# Patient Record
Sex: Male | Born: 1965 | Race: Black or African American | Hispanic: No | Smoking: Current every day smoker
Health system: Southern US, Community
[De-identification: ages and names within clinical notes are randomized; demographics above are authoritative.]

## PROBLEM LIST (undated history)

## (undated) DIAGNOSIS — F419 Anxiety disorder, unspecified: Secondary | ICD-10-CM

## (undated) DIAGNOSIS — E119 Type 2 diabetes mellitus without complications: Secondary | ICD-10-CM

## (undated) HISTORY — PX: BACK SURGERY: SHX140

---

## 2020-05-27 ENCOUNTER — Encounter (HOSPITAL_COMMUNITY): Payer: Self-pay | Admitting: Emergency Medicine

## 2020-05-27 ENCOUNTER — Emergency Department (HOSPITAL_COMMUNITY)
Admission: EM | Admit: 2020-05-27 | Discharge: 2020-05-27 | Disposition: A | Discharge status: Home / Self Care | Payer: Self-pay | Attending: Emergency Medicine | Admitting: Emergency Medicine

## 2020-05-27 ENCOUNTER — Other Ambulatory Visit: Payer: Self-pay

## 2020-05-27 DIAGNOSIS — Z5321 Procedure and treatment not carried out due to patient leaving prior to being seen by health care provider: Secondary | ICD-10-CM | POA: Insufficient documentation

## 2020-05-27 DIAGNOSIS — R739 Hyperglycemia, unspecified: Secondary | ICD-10-CM | POA: Insufficient documentation

## 2020-05-27 DIAGNOSIS — R5383 Other fatigue: Secondary | ICD-10-CM | POA: Insufficient documentation

## 2020-05-27 DIAGNOSIS — W540XXD Bitten by dog, subsequent encounter: Secondary | ICD-10-CM | POA: Insufficient documentation

## 2020-05-27 DIAGNOSIS — S31050D Open bite of lower back and pelvis without penetration into retroperitoneum, subsequent encounter: Secondary | ICD-10-CM | POA: Insufficient documentation

## 2020-05-27 DIAGNOSIS — R630 Anorexia: Secondary | ICD-10-CM | POA: Insufficient documentation

## 2020-05-27 HISTORY — DX: Type 2 diabetes mellitus without complications: E11.9

## 2020-05-27 LAB — CBC
HCT: 49.7 % (ref 39.0–52.0)
Hemoglobin: 16.9 g/dL (ref 13.0–17.0)
MCH: 31.2 pg (ref 26.0–34.0)
MCHC: 34 g/dL (ref 30.0–36.0)
MCV: 91.7 fL (ref 80.0–100.0)
Platelets: 292 10*3/uL (ref 150–400)
RBC: 5.42 MIL/uL (ref 4.22–5.81)
RDW: 12.6 % (ref 11.5–15.5)
WBC: 9.6 10*3/uL (ref 4.0–10.5)
nRBC: 0 % (ref 0.0–0.2)

## 2020-05-27 LAB — BASIC METABOLIC PANEL
Anion gap: 13 (ref 5–15)
BUN: 17 mg/dL (ref 6–20)
CO2: 25 mmol/L (ref 22–32)
Calcium: 10.1 mg/dL (ref 8.9–10.3)
Chloride: 101 mmol/L (ref 98–111)
Creatinine, Ser: 1 mg/dL (ref 0.61–1.24)
GFR, Estimated: >60 mL/min (ref >60)
Glucose, Bld: 262 mg/dL — ABNORMAL HIGH (ref 70–99)
Potassium: 3.9 mmol/L (ref 3.5–5.1)
Sodium: 139 mmol/L (ref 135–145)

## 2020-05-27 LAB — CBG MONITORING, ED
Glucose-Capillary: 258 mg/dL — ABNORMAL HIGH (ref 70–99)
Glucose-Capillary: 185 mg/dL — ABNORMAL HIGH (ref 70–99)

## 2020-05-27 NOTE — ED Triage Notes (Signed)
Patient endorses fatigue x3 weeks, decreased appetite despite drinking ensures, states he has lost 65 lbs over 6 months. Also states he got bit by a dog 2 weekends ago on his back, washed that out with alcohol.

## 2020-05-27 NOTE — ED Notes (Signed)
Pt given diet gingerale, graham crackers, and peanut butter. 

## 2020-05-27 NOTE — ED Notes (Signed)
Pt didn't answer x3.

## 2020-05-27 NOTE — ED Notes (Signed)
Pt arrived back to the lobby.  Sts he had to drive his son home.

## 2020-06-17 ENCOUNTER — Other Ambulatory Visit: Payer: Self-pay

## 2020-06-17 ENCOUNTER — Emergency Department (HOSPITAL_COMMUNITY): Payer: Self-pay

## 2020-06-17 ENCOUNTER — Encounter (HOSPITAL_COMMUNITY): Payer: Self-pay

## 2020-06-17 ENCOUNTER — Emergency Department (HOSPITAL_COMMUNITY)
Admission: EM | Admit: 2020-06-17 | Discharge: 2020-06-17 | Disposition: A | Discharge status: Home / Self Care | Payer: Self-pay | Attending: Emergency Medicine | Admitting: Emergency Medicine

## 2020-06-17 DIAGNOSIS — Z20822 Contact with and (suspected) exposure to covid-19: Secondary | ICD-10-CM | POA: Insufficient documentation

## 2020-06-17 DIAGNOSIS — R6889 Other general symptoms and signs: Secondary | ICD-10-CM

## 2020-06-17 DIAGNOSIS — R0602 Shortness of breath: Secondary | ICD-10-CM | POA: Insufficient documentation

## 2020-06-17 DIAGNOSIS — R5382 Chronic fatigue, unspecified: Secondary | ICD-10-CM | POA: Insufficient documentation

## 2020-06-17 DIAGNOSIS — R079 Chest pain, unspecified: Secondary | ICD-10-CM | POA: Insufficient documentation

## 2020-06-17 DIAGNOSIS — R1033 Periumbilical pain: Secondary | ICD-10-CM | POA: Insufficient documentation

## 2020-06-17 DIAGNOSIS — E1165 Type 2 diabetes mellitus with hyperglycemia: Secondary | ICD-10-CM | POA: Insufficient documentation

## 2020-06-17 DIAGNOSIS — E871 Hypo-osmolality and hyponatremia: Secondary | ICD-10-CM | POA: Insufficient documentation

## 2020-06-17 DIAGNOSIS — R635 Abnormal weight gain: Secondary | ICD-10-CM | POA: Insufficient documentation

## 2020-06-17 LAB — CBC
HCT: 46.6 % (ref 39.0–52.0)
Hemoglobin: 16.9 g/dL (ref 13.0–17.0)
MCH: 31.6 pg (ref 26.0–34.0)
MCHC: 36.3 g/dL — ABNORMAL HIGH (ref 30.0–36.0)
MCV: 87.3 fL (ref 80.0–100.0)
Platelets: 293 10*3/uL (ref 150–400)
RBC: 5.34 MIL/uL (ref 4.22–5.81)
RDW: 12.4 % (ref 11.5–15.5)
WBC: 11.8 10*3/uL — ABNORMAL HIGH (ref 4.0–10.5)
nRBC: 0 % (ref 0.0–0.2)

## 2020-06-17 LAB — BASIC METABOLIC PANEL
Anion gap: 14 (ref 5–15)
BUN: 25 mg/dL — ABNORMAL HIGH (ref 6–20)
CO2: 28 mmol/L (ref 22–32)
Calcium: 9.7 mg/dL (ref 8.9–10.3)
Chloride: 91 mmol/L — ABNORMAL LOW (ref 98–111)
Creatinine, Ser: 1.18 mg/dL (ref 0.61–1.24)
GFR, Estimated: >60 mL/min (ref >60)
Glucose, Bld: 353 mg/dL — ABNORMAL HIGH (ref 70–99)
Potassium: 3.5 mmol/L (ref 3.5–5.1)
Sodium: 133 mmol/L — ABNORMAL LOW (ref 135–145)

## 2020-06-17 LAB — CBG MONITORING, ED: Glucose-Capillary: 353 mg/dL — ABNORMAL HIGH (ref 70–99)

## 2020-06-17 LAB — TROPONIN I (HIGH SENSITIVITY): Troponin I (High Sensitivity): 5 ng/L (ref <18)

## 2020-06-17 LAB — RESP PANEL BY RT-PCR (FLU A&B, COVID) ARPGX2
Influenza A by PCR: NEGATIVE
Influenza B by PCR: NEGATIVE
SARS Coronavirus 2 by RT PCR: NEGATIVE

## 2020-06-17 MED ORDER — ONDANSETRON HCL 4 MG/2ML IJ SOLN: 4.0000 mg | Freq: Once | INTRAMUSCULAR | Status: DC

## 2020-06-17 MED ORDER — LACTATED RINGERS IV SOLN: INTRAVENOUS | Status: DC

## 2020-06-17 MED ORDER — INSULIN ASPART 100 UNIT/ML ~~LOC~~ SOLN: 8.0000 [IU] | Freq: Once | SUBCUTANEOUS | Status: DC

## 2020-06-17 NOTE — ED Provider Notes (Signed)
Care received at signout from [facility] [place]. Please see her note for full HPI.  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  Received care pending CTA of the chest, abdomen and pelvis.  Unfortunately, before I was able to see and evaluate the patient, nursing staff informed me that he had left gets medical advice.  Unfortunately was not able to have the risk versus benefit conversation with him.  I attempted to contact the patient via his cell phone, unfortunately no answer, voicemail box unfortunately has not been set up yet.   Mare Ferrari, PA-C 06/17/20 1613    Tegeler, Canary Brim, MD 06/17/20 2329

## 2020-06-17 NOTE — ED Triage Notes (Signed)
Pt reports 1.5 months of sob, upper back pain, chest pain, and no appetite. Pt states he has lost 40 lbs. Moved here in August and does not have a PCP yet. Pt a.o, nad noted

## 2020-06-17 NOTE — ED Notes (Signed)
Pt left AMA. RN called patient to come back to take out pts IV. RN was getting blood for another patient when he left AMA.

## 2020-06-17 NOTE — ED Provider Notes (Signed)
[facility] EMERGENCY DEPARTMENT Provider Note   CSN: [id number] Arrival date & time: 06/17/20  1059     History Chief Complaint  Patient presents with  . Shortness of Breath  . Chest Pain    Travis Wang is a 55 y.o. male with history of DM non compliant with metformin, tobacco use presents to ER for evaluation of exertional shortness of breath, fatigue, early satiety, abdominal pain and unintentional weight loss of 40# over the last 3 months. 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  States he gets easily winded now with day to day things. Winded when getting out of his truck. Has been feeling light headed and like he might faint. Yesterday he drove to [place] and it took him 12 hours because he had to pull over on the highway because he kept "dozing off".  Has thoracic back pain that he thinks is related to the bouncing around while driving truck.  No syncope. No CP, palpitations. No leg swelling, orthopnea, PND.  No cardiac history of STEMI, CHF. Has chronic "seasonal" smoker's cough that is usually worse around this time of the year.  No hemoptysis. No fever. Unvaccinated for COVID.  Also, reports periumbilical abdominal pain that is intermittent, non radiating. Pain is worse when his stomach is empty.  Associated with nausea but no vomiting, decreased appetite, early satiety.  Will take 2-3 bites of food and be full.  Abdominal pain improves for 30-45 min after smoking marijuana which he does daily.  Thought it could be an ulcer because he is under a lot of stress at home (recent divorce).  Has been constipated.  No melena. Recently moved to [place]. History of anxiety in the past, no longer on medicines. Not getting adequate sleep and wakes up every 1-2 hours during the night.   HPI     Past Medical History:  Diagnosis Date  . Diabetes mellitus without complication (HCC)     There are no problems to display for this patient.   Past  Surgical History:  Procedure Laterality Date  . BACK SURGERY         No family history on file.  Social History   Substance Use Topics  . Alcohol use: Not Currently  . Drug use: Not Currently    Home Medications Prior to Admission medications   Not on File    Allergies    Patient has no known allergies.  Review of Systems   Review of Systems  Constitutional: Positive for appetite change, fatigue and unexpected weight change.  Respiratory: Positive for cough and shortness of breath.   Gastrointestinal: Positive for abdominal pain, constipation and nausea.  Musculoskeletal: Positive for back pain.  All other systems reviewed and are negative.   Physical Exam Updated Vital Signs BP 117/89   Pulse (!) 112   Temp (!) 97.5 F (36.4 C) (Oral)   Resp 18   SpO2 100%   Physical Exam Vitals and nursing note reviewed.  Constitutional:      Appearance: He is well-developed.     Comments: Non toxic.  HENT:     Head: Normocephalic and atraumatic.     Nose: Nose normal.  Eyes:     Conjunctiva/sclera: Conjunctivae normal.  Cardiovascular:     Rate and Rhythm: Normal rate and regular rhythm.     Pulses:          Radial pulses are 1+ on the right side and  1+ on the left side.       Dorsalis pedis pulses are 1+ on the right side and 1+ on the left side.     Heart sounds: Normal heart sounds.     Comments: No lower extremity edema.  No calf tenderness. Pulmonary:     Effort: Pulmonary effort is normal.     Breath sounds: Normal breath sounds.     Comments: Normal work of breathing.  Dry cough noted during exam.  No wheezing, crackles. Abdominal:     General: Bowel sounds are normal.     Palpations: Abdomen is soft.     Tenderness: There is abdominal tenderness (Umbilical, mild).     Comments: No G/R/R. No suprapubic or CVA tenderness. Negative Murphy's and McBurney's  Musculoskeletal:        General: Normal range of motion.     Cervical back: Normal range of motion.   Skin:    General: Skin is warm and dry.     Capillary Refill: Capillary refill takes less than 2 seconds.  Neurological:     Mental Status: He is alert.     Comments: Sensation and strength intact in UE and LE   Psychiatric:        Behavior: Behavior normal.     ED Results / Procedures / Treatments   Labs (all labs ordered are listed, but only abnormal results are displayed) Labs Reviewed  BASIC METABOLIC PANEL - Abnormal; Notable for the following components:      Result Value   Sodium 133 (*)    Chloride 91 (*)    Glucose, Bld 353 (*)    BUN 25 (*)    All other components within normal limits  CBC - Abnormal; Notable for the following components:   WBC 11.8 (*)    MCHC 36.3 (*)    All other components within normal limits  CBG MONITORING, ED - Abnormal; Notable for the following components:   Glucose-Capillary 353 (*)    All other components within normal limits  RESP PANEL BY RT-PCR (FLU A&B, COVID) ARPGX2  TROPONIN I (HIGH SENSITIVITY)    EKG EKG Interpretation  Date/Time:  Wednesday June 17 2020 11:06:27 EST Ventricular Rate:  107 PR Interval:  128 QRS Duration: 74 QT Interval:  332 QTC Calculation: 443 R Axis:   87 Text Interpretation: Sinus tachycardia Right atrial enlargement Borderline ECG No old tracing to compare Confirmed by Meridee Score [phone]) on 06/17/2020 1:44:41 PM   Radiology DG Chest 2 View  Result Date: 06/17/2020 CLINICAL DATA:  Shortness of breath.  Chest pain. EXAM: CHEST - 2 VIEW COMPARISON:  None. FINDINGS: The heart size and mediastinal contours are within normal limits. Both lungs are clear. No visible pleural effusions or pneumothorax. No acute osseous abnormality. IMPRESSION: No active cardiopulmonary disease. Electronically Signed   By: Feliberto Harts MD   On: 06/17/2020 11:37    Procedures Procedures   Medications Ordered in ED Medications  insulin aspart (novoLOG) injection 8 Units (has no administration in time range)   lactated ringers infusion (has no administration in time range)  ondansetron (ZOFRAN) injection 4 mg (has no administration in time range)    ED Course  I have reviewed the triage vital signs and the nursing notes.  Pertinent labs & imaging results that were available during my care of the patient were reviewed by me and considered in my medical decision making (see chart for details).  Clinical Course as of 06/17/20 1549  Wed Jun 17, 2020  1415 WBC(!): 11.8 [CG]  1415 Sodium(!): 133 [CG]  1415 Glucose(!): 353 [CG]  1415 BUN(!): 25 [CG]  1415 Anion gap: 14 [CG]  1415 Troponin I (High Sensitivity): 5 [CG]  442 55 year old male here with multiple complaints including poor appetite early satiety weight loss shortness of breath constipation difficulty urinating.  Somewhat cachectic although nontoxic-appearing.  Lab work was unrevealing for significant derangements.  We will get a CT PE and CT abdomen pelvis due to his longstanding smoking history.  Likely discharge with outpatient follow-up if no significant findings. [MB]    Clinical Course User Index [CG] Liberty Handy, PA-C [MB] Terrilee Files, MD   MDM Rules/Calculators/A&P                           55 y.o. yo with chief complaint of several complaints including exertional shortness of breath, fatigue, disrupted sleep, thoracic back pain, umbilical abdominal pain, nausea, early satiety, unintentional weight loss for the last 3 months.  History of diabetes used to be on Metformin, noncompliant.  On history of tobacco use, marijuana use.  Previous medical records available, triage and nursing notes reviewed to obtain more history and assist with MDM. No medical records available.   Differential diagnosis: PUD/gastritis associated abdominal pain.  Given longstanding tobacco use, COPD also possibility.  No hypervolemia on exam, hypoxia, lower extremity edema or calf tenderness.  Given duration of his symptoms also considered PE  less likely although he is a Naval architect.  Doubt CHF.  He has significant amount of stress in his getting poor sleep which could be contributing.  No distal neuro pulse deficits, consider dissection, AAA highly unlikely. ? Covid.  ER lab work and imaging ordered by triage RN and me, as above  I have personally visualized and interpreted ER diagnostic work up including labs and imaging.    Labs reveal -labs are vastly reassuring.  Hyperglycemia glucose 353 with normal K, anion gap.  Mild pseudohyponatremia.  WBC 11.8.  Hemoglobin is normal.  Troponin is 5.  Patient is having no chest pain, I do not think delta is necessary.  Imaging reveals -EKG reviewed by EDP Charm Barges shows sinus tachycardia but no acute ST elevations, signs of right ventricular strain, classic pericarditis.   Medications ordered -insulin, Zofran, IV fluids  1548: Patient discussed with EDP Charm Barges.  We will go ahead and get CTA PE study and CT A/P given his consolation of symptoms.  Patient reevaluated and updated on plan of care and shift change.  Pending CTs and medicines at shift change.  Care transferred to oncoming ED PA who will follow up on imaging, reassess and determine disposition. Final Clinical Impression(s) / ED Diagnoses Final diagnoses:  Shortness of breath  Periumbilical abdominal pain  Unintentional weight change  Chronic fatigue    Rx / DC Orders ED Discharge Orders    None       Liberty Handy, PA-C 06/17/20 1549    Terrilee Files, MD 06/17/20 1726

## 2020-07-17 ENCOUNTER — Emergency Department (HOSPITAL_BASED_OUTPATIENT_CLINIC_OR_DEPARTMENT_OTHER): Payer: Self-pay

## 2020-07-17 ENCOUNTER — Emergency Department (HOSPITAL_BASED_OUTPATIENT_CLINIC_OR_DEPARTMENT_OTHER)
Admission: EM | Admit: 2020-07-17 | Discharge: 2020-07-17 | Disposition: A | Discharge status: Home / Self Care | Payer: Self-pay | Attending: Emergency Medicine | Admitting: Emergency Medicine

## 2020-07-17 ENCOUNTER — Ambulatory Visit
Admission: EM | Admit: 2020-07-17 | Discharge: 2020-07-17 | Disposition: A | Discharge status: Home / Self Care | Payer: Self-pay | Attending: Family Medicine | Admitting: Family Medicine

## 2020-07-17 ENCOUNTER — Emergency Department (HOSPITAL_BASED_OUTPATIENT_CLINIC_OR_DEPARTMENT_OTHER): Payer: Self-pay | Admitting: Radiology

## 2020-07-17 ENCOUNTER — Encounter (HOSPITAL_BASED_OUTPATIENT_CLINIC_OR_DEPARTMENT_OTHER): Payer: Self-pay | Admitting: Obstetrics and Gynecology

## 2020-07-17 ENCOUNTER — Other Ambulatory Visit: Payer: Self-pay

## 2020-07-17 ENCOUNTER — Encounter: Payer: Self-pay | Admitting: Emergency Medicine

## 2020-07-17 DIAGNOSIS — R634 Abnormal weight loss: Secondary | ICD-10-CM

## 2020-07-17 DIAGNOSIS — K5900 Constipation, unspecified: Secondary | ICD-10-CM

## 2020-07-17 DIAGNOSIS — E1165 Type 2 diabetes mellitus with hyperglycemia: Secondary | ICD-10-CM | POA: Insufficient documentation

## 2020-07-17 DIAGNOSIS — F17210 Nicotine dependence, cigarettes, uncomplicated: Secondary | ICD-10-CM | POA: Insufficient documentation

## 2020-07-17 DIAGNOSIS — R1084 Generalized abdominal pain: Secondary | ICD-10-CM

## 2020-07-17 DIAGNOSIS — R5383 Other fatigue: Secondary | ICD-10-CM | POA: Insufficient documentation

## 2020-07-17 DIAGNOSIS — R739 Hyperglycemia, unspecified: Secondary | ICD-10-CM

## 2020-07-17 DIAGNOSIS — Z7984 Long term (current) use of oral hypoglycemic drugs: Secondary | ICD-10-CM | POA: Insufficient documentation

## 2020-07-17 DIAGNOSIS — E119 Type 2 diabetes mellitus without complications: Secondary | ICD-10-CM

## 2020-07-17 DIAGNOSIS — R42 Dizziness and giddiness: Secondary | ICD-10-CM

## 2020-07-17 HISTORY — DX: Anxiety disorder, unspecified: F41.9

## 2020-07-17 LAB — I-STAT VENOUS BLOOD GAS, ED
Acid-Base Excess: 3 mmol/L — ABNORMAL HIGH (ref 0.0–2.0)
Bicarbonate: 27.2 mmol/L (ref 20.0–28.0)
Calcium, Ion: 1.21 mmol/L (ref 1.15–1.40)
HCT: 50 % (ref 39.0–52.0)
Hemoglobin: 17 g/dL (ref 13.0–17.0)
O2 Saturation: 74 %
Patient temperature: 98.6
Potassium: 3.8 mmol/L (ref 3.5–5.1)
Sodium: 133 mmol/L — ABNORMAL LOW (ref 135–145)
TCO2: 28 mmol/L (ref 22–32)
pCO2, Ven: 40.7 mmHg — ABNORMAL LOW (ref 44.0–60.0)
pH, Ven: 7.433 — ABNORMAL HIGH (ref 7.250–7.430)
pO2, Ven: 38 mmHg (ref 32.0–45.0)

## 2020-07-17 LAB — POCT URINALYSIS DIP (MANUAL ENTRY)
Blood, UA: NEGATIVE
Glucose, UA: >=1000 mg/dL — AB
Nitrite, UA: NEGATIVE
Protein Ur, POC: 30 mg/dL — AB
Spec Grav, UA: 1.025 (ref 1.010–1.025)
Urobilinogen, UA: 1 E.U./dL
pH, UA: 6 (ref 5.0–8.0)

## 2020-07-17 LAB — CBC WITH DIFFERENTIAL/PLATELET
Abs Immature Granulocytes: 0.03 10*3/uL (ref 0.00–0.07)
Basophils Absolute: 0.1 10*3/uL (ref 0.0–0.1)
Basophils Relative: 0 %
Eosinophils Absolute: 0.1 10*3/uL (ref 0.0–0.5)
Eosinophils Relative: 0 %
HCT: 47.7 % (ref 39.0–52.0)
Hemoglobin: 16.9 g/dL (ref 13.0–17.0)
Immature Granulocytes: 0 %
Lymphocytes Relative: 25 %
Lymphs Abs: 2.9 10*3/uL (ref 0.7–4.0)
MCH: 31.1 pg (ref 26.0–34.0)
MCHC: 35.4 g/dL (ref 30.0–36.0)
MCV: 87.7 fL (ref 80.0–100.0)
Monocytes Absolute: 0.9 10*3/uL (ref 0.1–1.0)
Monocytes Relative: 8 %
Neutro Abs: 7.7 10*3/uL (ref 1.7–7.7)
Neutrophils Relative %: 67 %
Platelets: 270 10*3/uL (ref 150–400)
RBC: 5.44 MIL/uL (ref 4.22–5.81)
RDW: 12.7 % (ref 11.5–15.5)
WBC: 11.6 10*3/uL — ABNORMAL HIGH (ref 4.0–10.5)
nRBC: 0 % (ref 0.0–0.2)

## 2020-07-17 LAB — COMPREHENSIVE METABOLIC PANEL
ALT: 13 U/L (ref 0–44)
AST: 10 U/L — ABNORMAL LOW (ref 15–41)
Albumin: 5.1 g/dL — ABNORMAL HIGH (ref 3.5–5.0)
Alkaline Phosphatase: 43 U/L (ref 38–126)
Anion gap: 13 (ref 5–15)
BUN: 31 mg/dL — ABNORMAL HIGH (ref 6–20)
CO2: 25 mmol/L (ref 22–32)
Calcium: 10.4 mg/dL — ABNORMAL HIGH (ref 8.9–10.3)
Chloride: 96 mmol/L — ABNORMAL LOW (ref 98–111)
Creatinine, Ser: 0.99 mg/dL (ref 0.61–1.24)
GFR, Estimated: >60 mL/min (ref >60)
Glucose, Bld: 253 mg/dL — ABNORMAL HIGH (ref 70–99)
Potassium: 3.7 mmol/L (ref 3.5–5.1)
Sodium: 134 mmol/L — ABNORMAL LOW (ref 135–145)
Total Bilirubin: 0.9 mg/dL (ref 0.3–1.2)
Total Protein: 8 g/dL (ref 6.5–8.1)

## 2020-07-17 LAB — HEMOGLOBIN A1C
Hgb A1c MFr Bld: 9.5 % — ABNORMAL HIGH (ref 4.8–5.6)
Mean Plasma Glucose: 225.95 mg/dL

## 2020-07-17 LAB — TROPONIN I (HIGH SENSITIVITY): Troponin I (High Sensitivity): 3 ng/L (ref <18)

## 2020-07-17 LAB — LIPASE, BLOOD: Lipase: 16 U/L (ref 11–51)

## 2020-07-17 LAB — CBG MONITORING, ED: Glucose-Capillary: 250 mg/dL — ABNORMAL HIGH (ref 70–99)

## 2020-07-17 LAB — BRAIN NATRIURETIC PEPTIDE: B Natriuretic Peptide: 16.9 pg/mL (ref 0.0–100.0)

## 2020-07-17 LAB — D-DIMER, QUANTITATIVE: D-Dimer, Quant: <0.27 ug/mL-FEU (ref 0.00–0.50)

## 2020-07-17 LAB — POCT FASTING CBG KUC MANUAL ENTRY: POCT Glucose (KUC): 269 mg/dL — AB (ref 70–99)

## 2020-07-17 MED ORDER — IOHEXOL 300 MG/ML  SOLN
100.0000 mL | Freq: Once | INTRAMUSCULAR | Status: AC | PRN
  Administered 2020-07-17: 100 mL via INTRAVENOUS

## 2020-07-17 MED ORDER — SODIUM CHLORIDE 0.9 % IV BOLUS
1000.0000 mL | Freq: Once | INTRAVENOUS | Status: AC
  Administered 2020-07-17: 1000 mL via INTRAVENOUS

## 2020-07-17 MED ORDER — POLYETHYLENE GLYCOL 3350 17 G PO PACK: 17.0000 g | PACK | Freq: Every day | ORAL | 0 refills | Status: AC

## 2020-07-17 MED ORDER — METFORMIN HCL 500 MG PO TABS: 500.0000 mg | ORAL_TABLET | Freq: Two times a day (BID) | ORAL | 1 refills | Status: DC

## 2020-07-17 NOTE — ED Provider Notes (Signed)
[facility]    CSN: [id number] Arrival date & time: 07/17/20  1320      History   Chief Complaint Chief Complaint  Patient presents with  . Abdominal Pain  . Constipation    HPI Travis Wang is a 55 y.o. male.   HPI  Patient presents for evaluation of ongoing abdominal pain , constipation, and uncontrolled diabetes. Patient seen at ER on 06-17-2020 with SOB, abdominal pain and unintentional weight loss. Patient's blood sugar was found to be mid 300's. He doesn't take medication for diabetes or does he have a primary care due to financial problems.  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  He has been checking his blood glucose intermittently and has noticed this been progressively rising.  He reports that his baseline weight is about 210 and currently he is weighing approximately 170-175 lbs and this weight loss is taking place over the course of the last 3 to 4 months.  He has taken laxative and has been unable to pass stool.  Due to inability to pass stool he is felt full without eating much food and has had shortness of breath in the midepigastric area.  Past Medical History:  Diagnosis Date  . Diabetes mellitus without complication (HCC)     There are no problems to display for this patient.   Past Surgical History:  Procedure Laterality Date  . BACK SURGERY         Home Medications    Prior to Admission medications   Not on File    Family History History reviewed. No pertinent family history.  Social History Social History   Substance Use Topics  . Alcohol use: Not Currently  . Drug use: Not Currently     Allergies   Patient has no known allergies.   Review of Systems Review of Systems Pertinent negatives listed in HPI   Physical Exam Triage Vital Signs ED Triage Vitals [07/17/20 1433]  Enc Vitals Group     BP 117/78     Pulse Rate (!) 116     Resp (!) 22     Temp 98.8 F (37.1 C)     Temp Source  Oral     SpO2 97 %     Weight      Height      Head Circumference      Peak Flow      Pain Score 4     Pain Loc      Pain Edu?      Excl. in GC?    No data found.  Updated Vital Signs BP 117/78 (BP Location: Left Arm)   Pulse (!) 116   Temp 98.8 F (37.1 C) (Oral)   Resp (!) 22   SpO2 97%   Visual Acuity Right Eye Distance:   Left Eye Distance:   Bilateral Distance:    Right Eye Near:   Left Eye Near:    Bilateral Near:     Physical Exam Constitutional:      Appearance: He is underweight. He is ill-appearing.     Comments: Unhealthy appearance and appears older than stated age  Cardiovascular:     Rate and Rhythm: Regular rhythm. Tachycardia present.  Pulmonary:     Effort: Pulmonary effort is normal.     Breath sounds: Normal breath sounds.  Abdominal:     General: Bowel sounds are decreased.     Tenderness: There is generalized abdominal tenderness.  Skin:  General: Skin is warm.     Comments: Mild jaundice appearance  Neurological:     General: No focal deficit present.  Psychiatric:        Mood and Affect: Mood normal.        Behavior: Behavior normal.      UC Treatments / Results  Labs (all labs ordered are listed, but only abnormal results are displayed) Labs Reviewed  POCT FASTING CBG [facility] MANUAL ENTRY - Abnormal; Notable for the following components:      Result Value   POCT Glucose ([facility]) 269 (*)    All other components within normal limits  POCT URINALYSIS DIP (MANUAL ENTRY)    EKG   Radiology No results found.  Procedures Procedures (including critical care time)  Medications Ordered in UC Medications - No data to display  Initial Impression / Assessment and Plan / UC Course  I have reviewed the triage vital signs and the nursing notes.  Pertinent labs & imaging results that were available during my care of the patient were reviewed by me and considered in my medical decision making (see chart for details).    Given  presentation of patient and hyperglycemia with UA revealing a large amount of ketones and glucose present patient needs further work-up including labs and further diagnostic assessment in the setting of the emergency department to determine source of acute weight loss, labs to assess for possible DKA, and further work-up of possible gastroparesis or other etiology that is causing severe constipation and inability to pass stool along with severe abdominal pain.  Patient sent med [address] bridge for further work-up and evaluation.  Patient also is requesting extended leave from work due to current medical concerns and he does not have a primary care provider patient was advised to follow-up after ER visit at occupational medicine for evaluation of fitness to return to work. Final Clinical Impressions(s) / UC Diagnoses   Final diagnoses:  Hyperglycemia  Unintentional weight loss  Dizzinesses  Constipation in male     Discharge Instructions     Go immediately to emergency department for further work-up and evaluation of ongoing abdominal pain, inability to pass stool for greater than a week, should shortness of breath, ketones in urine and persistent hyperglycemia. The symptoms can be life-threatening therefore go immediately to the emergency department to determine cause of symptoms.    ED Prescriptions    None     PDMP not reviewed this encounter.   Bing Neighbors, FNP 07/17/20 [phone]

## 2020-07-17 NOTE — TOC Initial Note (Signed)
..  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 States he does not have PCP or insurance. Scheduled follow up appt at [facility] on 08/20/2020 at 2:10 pm. Pt will need one refill on his meds. Explained [facility] has $4 list for their meds. ED provider updated.      ED Mini Assessment: What brought you to the Emergency Department? : dizziness  Barriers to Discharge: Continued Medical Work up  [employer] interventions: appt arranged for follow up  Means of departure: Car  Interventions which prevented an admission or readmission: Follow-up medical appointment,Medication Review    Patient Contact and Communications        ,          Patient states their goals for this hospitalization and ongoing recovery are:: wants to be able to work      Admission diagnosis:  sent by dr,possible dka There are no problems to display for this patient.  PCP:  Patient, No Pcp Per Pharmacy:   [facility] [address] ([address]),  - [address] [place] [address] Phone: [phone] Fax: [phone]

## 2020-07-17 NOTE — Discharge Instructions (Addendum)
Go immediately to emergency department for further work-up and evaluation of ongoing abdominal pain, inability to pass stool for greater than a week, should shortness of breath, ketones in urine and persistent hyperglycemia. The symptoms can be life-threatening therefore go immediately to the emergency department to determine cause of symptoms.

## 2020-07-17 NOTE — ED Notes (Signed)
Venous Blood Gas obtained with IV drawl, MD shown results.

## 2020-07-17 NOTE — ED Triage Notes (Signed)
Pt here for abd pain with constipation no BM x 10 days; pt sts taking laxatives without result; pt seen last month for weight loss; pt sts increased CBG; pt hasnt followed up about either due to financial situation

## 2020-07-17 NOTE — ED Notes (Signed)
ED Provider at bedside. 

## 2020-07-17 NOTE — ED Provider Notes (Signed)
[facility] EMERGENCY DEPT Provider Note   CSN: [id number] Arrival date & time: 07/17/20  1631     History Chief Complaint  Patient presents with  . Hyperglycemia  . Abdominal Pain    Travis Wang is a 55 y.o. male.  Presented to the emergency room with concern for abdominal pain, weight loss, elevated blood sugar.  Patient went to urgent care earlier today, sent to ER due to abnormal urinalysis, rule out DKA.  Patient reports that over the past few months he has had some weight loss, also has increased fatigue.  States that he gets winded easily.  Does not feel short of breath at present.  No chest pain at present.  States that he struggled with abdominal pain over the past month.  Intermittent, improved with eating.  Not currently having pain.  Described as tightness across abdomen.  He reports he has been diagnosed with diabetes previously but has not been on his Metformin due to not having a local doctor.  HPI     Past Medical History:  Diagnosis Date  . Anxiety   . Diabetes mellitus without complication (HCC)     There are no problems to display for this patient.   Past Surgical History:  Procedure Laterality Date  . BACK SURGERY         No family history on file.  Social History   Tobacco Use  . Smoking status: Current Every Day Smoker    Types: Cigarettes  Vaping Use  . Vaping Use: Every day  . Substances: Nicotine  Substance Use Topics  . Alcohol use: Yes  . Drug use: Yes    Types: Marijuana    Home Medications Prior to Admission medications   Medication Sig Start Date End Date Taking? Authorizing Provider  metFORMIN (GLUCOPHAGE) 500 MG tablet Take 1 tablet (500 mg total) by mouth 2 (two) times daily with a meal. 07/17/20  Yes Natashia Roseman, Quitman Livings, MD  polyethylene glycol (MIRALAX) 17 g packet Take 17 g by mouth daily. 07/17/20  Yes Milagros Loll, MD    Allergies    Patient has no known allergies.  Review of Systems   Review  of Systems  Constitutional: Positive for fatigue. Negative for chills and fever.  HENT: Negative for ear pain and sore throat.   Eyes: Negative for pain and visual disturbance.  Respiratory: Positive for shortness of breath. Negative for cough.   Cardiovascular: Negative for chest pain and palpitations.  Gastrointestinal: Positive for abdominal pain. Negative for vomiting.  Genitourinary: Negative for dysuria and hematuria.  Musculoskeletal: Negative for arthralgias and back pain.  Skin: Negative for color change and rash.  Neurological: Negative for seizures and syncope.  All other systems reviewed and are negative.   Physical Exam Updated Vital Signs BP 110/66 (BP Location: Right Arm)   Pulse 76   Temp 98 F (36.7 C) (Oral)   Resp (!) 22   Ht 5\' 10"  (1.778 m)   Wt 83.9 kg   SpO2 100%   BMI 26.54 kg/m   Physical Exam Vitals and nursing note reviewed.  Constitutional:      Appearance: He is well-developed.  HENT:     Head: Normocephalic and atraumatic.  Eyes:     Conjunctiva/sclera: Conjunctivae normal.  Cardiovascular:     Rate and Rhythm: Normal rate and regular rhythm.     Heart sounds: No murmur heard.   Pulmonary:     Effort: Pulmonary effort is normal. No respiratory distress.  Breath sounds: Normal breath sounds.  Abdominal:     Palpations: Abdomen is soft.     Tenderness: There is no abdominal tenderness.     Comments: Some tenderness noted in center of abdomen, no rebound or guarding, no tenderness at McBurney's point and negative Murphy sign  Musculoskeletal:     Cervical back: Neck supple.  Skin:    General: Skin is warm and dry.  Neurological:     General: No focal deficit present.     Mental Status: He is alert.  Psychiatric:        Mood and Affect: Mood normal.        Behavior: Behavior normal.     ED Results / Procedures / Treatments   Labs (all labs ordered are listed, but only abnormal results are displayed) Labs Reviewed  CBC WITH  DIFFERENTIAL/PLATELET - Abnormal; Notable for the following components:      Result Value   WBC 11.6 (*)    All other components within normal limits  COMPREHENSIVE METABOLIC PANEL - Abnormal; Notable for the following components:   Sodium 134 (*)    Chloride 96 (*)    Glucose, Bld 253 (*)    BUN 31 (*)    Calcium 10.4 (*)    Albumin 5.1 (*)    AST 10 (*)    All other components within normal limits  CBG MONITORING, ED - Abnormal; Notable for the following components:   Glucose-Capillary 250 (*)    All other components within normal limits  I-STAT VENOUS BLOOD GAS, ED - Abnormal; Notable for the following components:   pH, Ven 7.433 (*)    pCO2, Ven 40.7 (*)    Acid-Base Excess 3.0 (*)    Sodium 133 (*)    All other components within normal limits  LIPASE, BLOOD  D-DIMER, QUANTITATIVE  BRAIN NATRIURETIC PEPTIDE  BLOOD GAS, VENOUS  HEMOGLOBIN A1C  TROPONIN I (HIGH SENSITIVITY)    EKG None  Radiology DG Chest 2 View  Result Date: 07/17/2020 CLINICAL DATA:  Chest pain and dizziness. EXAM: CHEST - 2 VIEW COMPARISON:  Chest x-ray 06/17/2020 FINDINGS: The cardiac silhouette, mediastinal and hilar contours are normal. The lungs are clear. No pleural effusions. No pulmonary lesions. No pneumothorax. The bony thorax is intact. IMPRESSION: No acute cardiopulmonary findings. Electronically Signed   By: Rudie Meyer M.D.   On: 07/17/2020 17:54   CT ABDOMEN PELVIS W CONTRAST  Result Date: 07/17/2020 CLINICAL DATA:  Acute abdominal pain. EXAM: CT ABDOMEN AND PELVIS WITH CONTRAST TECHNIQUE: Multidetector CT imaging of the abdomen and pelvis was performed using the standard protocol following bolus administration of intravenous contrast. CONTRAST:  OMNIPAQUE IOHEXOL 300 MG/ML  SOLN COMPARISON:  None. FINDINGS: Lower chest: The lung bases are clear of acute process. No pleural effusion or pulmonary lesions. The heart is normal in size. No pericardial effusion. The distal esophagus and  aorta are unremarkable. Hepatobiliary: No hepatic lesions or intrahepatic biliary dilatation. The gallbladder is normal. No common bile duct dilatation. Pancreas: No mass, inflammation or ductal dilatation. Mild pancreatic atrophy. Spleen: Normal size. Small enhancing lesion in the upper aspect of the spleen likely benign hemangioma. Adrenals/Urinary Tract: The adrenal glands and kidneys are unremarkable. No renal lesions, renal calculi or hydronephrosis. No findings suspicious for pyelonephritis. The bladder is unremarkable. Stomach/Bowel: The stomach, duodenum, small bowel and colon are unremarkable. No acute inflammatory changes, mass lesions or obstructive findings. The terminal ileum is normal. The appendix is normal. Vascular/Lymphatic: The aorta is  normal in caliber. No dissection. The branch vessels are patent. The major venous structures are patent. No mesenteric or retroperitoneal mass or adenopathy. Small scattered lymph nodes are noted. Reproductive: The prostate gland and seminal vesicles are unremarkable. Other: No pelvic mass or adenopathy. No free pelvic fluid collections. No inguinal mass or adenopathy. No abdominal wall hernia or subcutaneous lesions. Musculoskeletal: No significant bony findings. Moderate age advanced degenerative disc disease noted at L5-S1. IMPRESSION: 1. No acute abdominal/pelvic findings, mass lesions or adenopathy. 2. Age advanced degenerative disc disease at L5-S1. Electronically Signed   By: Rudie Meyer M.D.   On: 07/17/2020 18:56    Procedures Procedures   Medications Ordered in ED Medications  sodium chloride 0.9 % bolus 1,000 mL (0 mLs Intravenous Stopped 07/17/20 1918)  iohexol (OMNIPAQUE) 300 MG/ML solution 100 mL (100 mLs Intravenous Contrast Given 07/17/20 1820)    ED Course  I have reviewed the triage vital signs and the nursing notes.  Pertinent labs & imaging results that were available during my care of the patient were reviewed by me and  considered in my medical decision making (see chart for details).    MDM Rules/Calculators/A&P                         55 year old male presents to ER with concern for abnormal UA, elevated blood sugar, abdominal pain, weight loss, fatigue.  On exam, patient is well-appearing with stable vital signs.  Noted some mild tenderness in abdomen but no rebound or guarding was noted.  Basic labs were grossly stable.  They are not consistent with DKA.  Suspect his modest hyperglycemia is related to diabetes.  CT abdomen pelvis negative for acute pathology.  Recommend he start back on Metformin.  Recommend he follow-up with a primary doctor.  Case management made arrangements for [facility] health and wellness follow-up.  Patient was also concerned about constipation, recommended MiraLAX.   After the discussed management above, the patient was determined to be safe for discharge.  The patient was in agreement with this plan and all questions regarding their care were answered.  ED return precautions were discussed and the patient will return to the ED with any significant worsening of condition.   Final Clinical Impression(s) / ED Diagnoses Final diagnoses:  Elevated blood sugar  Type 2 diabetes mellitus without complication, without long-term current use of insulin (HCC)  Fatigue, unspecified type  Generalized abdominal pain  Constipation, unspecified constipation type    Rx / DC Orders ED Discharge Orders         Ordered    metFORMIN (GLUCOPHAGE) 500 MG tablet  2 times daily with meals        07/17/20 1911    polyethylene glycol (MIRALAX) 17 g packet  Daily        07/17/20 1911           Milagros Loll, MD 07/17/20 1939

## 2020-07-17 NOTE — ED Notes (Signed)
Patient given warm blanket and fluids restarted. Patient denies needs at this time.

## 2020-07-17 NOTE — Discharge Instructions (Signed)
Follow-up with community health and wellness as discussed.  Take Metformin as discussed.  For your constipation, I would recommend tomorrow morning take 1 dose of MiraLAX every 1-2 hours up to 4 or 5 doses or until you have a bowel movement.  Then on subsequent days I would recommend taking 1 dose of MiraLAX just once a day.  Take Tylenol or Motrin for pain control.  If you develop nausea, vomiting, fevers, worsening abdominal pain or other new concerning symptom, return to ER for reassessment.

## 2020-07-17 NOTE — ED Triage Notes (Signed)
Patient reports here from urgent care. Patient reports he was there for constipation. Patient states he has lost 30lbs in the last 29months . Patient reports no BM x10 days. Patient reports CBG 256.

## 2020-07-20 ENCOUNTER — Ambulatory Visit: Payer: Self-pay | Admitting: *Deleted

## 2020-07-20 NOTE — Telephone Encounter (Signed)
See previous TE for summary and advice provided to the patient.    Travis Fells, RN  Registered Nurse    Telephone Encounter  Sign at exiting of workspace  Encounter Date:  07/20/2020           Sign at exiting of workspace           Show:Clear all [x] Manual[] Template[] Copied  Added by: [x] [name], , RN   [] Hover for details   Patient calls with multiple concerns. Abdominal pain with headaches and dizziness and occasional SOB for 2 months at least he reported. Was seen in the ED on 07/17/20 with an A1c of 9.5 and no history of DM. Prescribed Metformin 500 mg taken twice daily with meals. Reported taken as prescribed. Instructed to check cbg fasting in am and at hs, keep log of sugars, no sugary drinks, teas, juices. Stay away from fried and fatty foods and starches. Drink plenty of water everyday. Seek treatment at the ED if experiencing SOB/dizziness/CP/vision changes. 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 Verbalized understanding.               Nurse Triage on 07/20/2020      Nurse Triage on 07/20/2020         Detailed Report        Additional Documentation  Care Advice:  Patient/Caregiver will follow care advice?: Yes, plans to follow advice     HOME CARE:,   ANSWER THE QUESTION:,   CONTINUE TREATMENT:,   CALL BACK IF:,   CARE ADVICE per Post-Hospitalization Follow-Up Cal...     Protocols Used:  Post-Hospitalization Follow-up Call-A-AH     Encounter Info:  Billing Info,   History,   Allergies,   Detailed Report       Orders Placed   None   Medication Renewals and Changes     None    Medication List   Visit Diagnoses     None    Problem List     Reason for Disposition . [1] Patient is improved AND [2] caller has simple question that triager can answer  Answer Assessment -  Initial Assessment Questions 1. MAIN CONCERN OR SYMPTOM:  "What is your main concern right now?" "What question do you have?" "What's the main symptom you're worried about?" (e.g., breathing difficulty, ankle swelling, weight gain.)     Headaches, stomach pain 2. ONSET: "When did the symptoms start?"     About 2 months ago 3. BETTER-SAME-WORSE: "Are you getting better, staying the same, or getting worse compared to the day you were discharged?"     Not getting better 4. HOSPITALIZATION: "How long were you hospitalized?" (e.g., days)     ED visit only 5. DISCHARGE DIAGNOSIS:  "What problem or disease were you hospitalized for?"     na 6. DISCHARGE DATE: "What date were you discharged from the hospital?"     na 7. DISCHARGE DOCTOR: "Who is the main doctor taking care of you now?"     na 8. DISCHARGE APPOINTMENT: "Have you scheduled a follow-up discharge appointment with your doctor?"     08/20/20 9. DISCHARGE MEDICATIONS: "Did the physician who discharged you order any new medications for you to use? If yes, have you filled the prescription and started taking the medication?"      Metformin and Miralax 10. PAIN: "Is there any pain?" If Yes, ask: "How bad is it?"  (  Scale 1-10; or mild, moderate, severe)       Sometimes severe, other times mild to moderate 11. FEVER: "Do you have a fever?" If Yes, ask: "What is it, how was it measured  and when did it start?"       No 12. OTHER SYMPTOMS: "Do you have any other symptoms?"       Yes  Protocols used: POST-HOSPITALIZATION FOLLOW-UP CALL-A-AH

## 2020-07-20 NOTE — Telephone Encounter (Signed)
Patient calls with multiple concerns. Abdominal pain with headaches and dizziness and occasional SOB for 2 months at least he reported. Was seen in the ED on 07/17/20 with an A1c of 9.5 and no history of DM. Prescribed Metformin 500 mg taken twice daily with meals. Reported taken as prescribed. Instructed to check cbg fasting in am and at hs, keep log of sugars, no sugary drinks, teas, juices. Stay away from fried and fatty foods and starches. Drink plenty of water everyday. Seek treatment at the ED if experiencing SOB/dizziness/CP/vision changes. 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 Verbalized understanding.

## 2020-08-15 ENCOUNTER — Emergency Department (HOSPITAL_COMMUNITY): Payer: Self-pay

## 2020-08-15 ENCOUNTER — Other Ambulatory Visit: Payer: Self-pay

## 2020-08-15 ENCOUNTER — Emergency Department (HOSPITAL_COMMUNITY)
Admission: EM | Admit: 2020-08-15 | Discharge: 2020-08-15 | Disposition: A | Discharge status: Home / Self Care | Payer: Self-pay | Attending: Emergency Medicine | Admitting: Emergency Medicine

## 2020-08-15 ENCOUNTER — Encounter (HOSPITAL_COMMUNITY): Payer: Self-pay | Admitting: Emergency Medicine

## 2020-08-15 DIAGNOSIS — R112 Nausea with vomiting, unspecified: Secondary | ICD-10-CM

## 2020-08-15 DIAGNOSIS — R739 Hyperglycemia, unspecified: Secondary | ICD-10-CM

## 2020-08-15 DIAGNOSIS — E1165 Type 2 diabetes mellitus with hyperglycemia: Secondary | ICD-10-CM | POA: Insufficient documentation

## 2020-08-15 DIAGNOSIS — Z7984 Long term (current) use of oral hypoglycemic drugs: Secondary | ICD-10-CM | POA: Insufficient documentation

## 2020-08-15 DIAGNOSIS — F17210 Nicotine dependence, cigarettes, uncomplicated: Secondary | ICD-10-CM | POA: Insufficient documentation

## 2020-08-15 LAB — URINALYSIS, ROUTINE W REFLEX MICROSCOPIC
Bacteria, UA: NONE SEEN
Bilirubin Urine: NEGATIVE
Glucose, UA: >=500 mg/dL — AB
Hgb urine dipstick: NEGATIVE
Ketones, ur: 5 mg/dL — AB
Leukocytes,Ua: NEGATIVE
Nitrite: NEGATIVE
Protein, ur: NEGATIVE mg/dL
Specific Gravity, Urine: 1.025 (ref 1.005–1.030)
pH: 5 (ref 5.0–8.0)

## 2020-08-15 LAB — COMPREHENSIVE METABOLIC PANEL
ALT: 16 U/L (ref 0–44)
AST: 16 U/L (ref 15–41)
Albumin: 4.8 g/dL (ref 3.5–5.0)
Alkaline Phosphatase: 54 U/L (ref 38–126)
Anion gap: 17 — ABNORMAL HIGH (ref 5–15)
BUN: 42 mg/dL — ABNORMAL HIGH (ref 6–20)
CO2: 23 mmol/L (ref 22–32)
Calcium: 10.1 mg/dL (ref 8.9–10.3)
Chloride: 85 mmol/L — ABNORMAL LOW (ref 98–111)
Creatinine, Ser: 1.48 mg/dL — ABNORMAL HIGH (ref 0.61–1.24)
GFR, Estimated: 56 mL/min — ABNORMAL LOW (ref >60)
Glucose, Bld: 476 mg/dL — ABNORMAL HIGH (ref 70–99)
Potassium: 3.7 mmol/L (ref 3.5–5.1)
Sodium: 125 mmol/L — ABNORMAL LOW (ref 135–145)
Total Bilirubin: 1.1 mg/dL (ref 0.3–1.2)
Total Protein: 8 g/dL (ref 6.5–8.1)

## 2020-08-15 LAB — CBC
HCT: 47.5 % (ref 39.0–52.0)
Hemoglobin: 17.3 g/dL — ABNORMAL HIGH (ref 13.0–17.0)
MCH: 31.7 pg (ref 26.0–34.0)
MCHC: 36.4 g/dL — ABNORMAL HIGH (ref 30.0–36.0)
MCV: 87 fL (ref 80.0–100.0)
Platelets: 334 10*3/uL (ref 150–400)
RBC: 5.46 MIL/uL (ref 4.22–5.81)
RDW: 12 % (ref 11.5–15.5)
WBC: 13 10*3/uL — ABNORMAL HIGH (ref 4.0–10.5)
nRBC: 0 % (ref 0.0–0.2)

## 2020-08-15 LAB — TROPONIN I (HIGH SENSITIVITY)
Troponin I (High Sensitivity): 5 ng/L (ref <18)
Troponin I (High Sensitivity): 5 ng/L (ref <18)

## 2020-08-15 LAB — CBG MONITORING, ED: Glucose-Capillary: 265 mg/dL — ABNORMAL HIGH (ref 70–99)

## 2020-08-15 LAB — LIPASE, BLOOD: Lipase: 39 U/L (ref 11–51)

## 2020-08-15 MED ORDER — METOCLOPRAMIDE HCL 5 MG/ML IJ SOLN
10.0000 mg | Freq: Once | INTRAMUSCULAR | Status: AC
  Administered 2020-08-15: 10 mg via INTRAVENOUS
  Filled 2020-08-15: qty 2

## 2020-08-15 MED ORDER — SODIUM CHLORIDE 0.9 % IV BOLUS
1000.0000 mL | Freq: Once | INTRAVENOUS | Status: AC
  Administered 2020-08-15: 1000 mL via INTRAVENOUS

## 2020-08-15 MED ORDER — METOCLOPRAMIDE HCL 10 MG PO TABS: 10.0000 mg | ORAL_TABLET | Freq: Four times a day (QID) | ORAL | 0 refills | Status: DC

## 2020-08-15 MED ORDER — PANTOPRAZOLE SODIUM 40 MG IV SOLR
40.0000 mg | Freq: Once | INTRAVENOUS | Status: AC
  Administered 2020-08-15: 40 mg via INTRAVENOUS
  Filled 2020-08-15: qty 40

## 2020-08-15 MED ORDER — HYDROXYZINE HCL 25 MG PO TABS: 25.0000 mg | ORAL_TABLET | Freq: Four times a day (QID) | ORAL | 0 refills | Status: DC

## 2020-08-15 MED ORDER — METFORMIN HCL 500 MG PO TABS: 500.0000 mg | ORAL_TABLET | Freq: Two times a day (BID) | ORAL | 1 refills | Status: DC

## 2020-08-15 MED ORDER — ONDANSETRON HCL 4 MG/2ML IJ SOLN
4.0000 mg | Freq: Once | INTRAMUSCULAR | Status: AC
  Administered 2020-08-15: 4 mg via INTRAVENOUS
  Filled 2020-08-15: qty 2

## 2020-08-15 NOTE — ED Triage Notes (Signed)
Pt to triage via [facility] from home.  Reports anxiety x 3 months. Weight loss 20 lbs in 1 month.  Family reports ? Seizure activity lasting 2-3 minutes when he sat up in bed this morning. No seizure history.  States his eyes were open but he wasn't responding.  C/o intermittent epigastric pressure x 3 months.  Tremors on EMS arrival.   18 g R AC.  No appetite and vomiting x 4 days.  Pt also reports sternal pain with ambulating.

## 2020-08-15 NOTE — ED Notes (Signed)
Ask pt for urine sample pt stated that he did not need to urinate at this time.

## 2020-08-15 NOTE — ED Notes (Signed)
DC  Instructions reviewed with pt.  PT verbalized instructions.  PT DC.

## 2020-08-15 NOTE — ED Provider Notes (Signed)
I assumed care of patient from previous team at shift change.  Please see their note for full H&P. Physical Exam  BP 106/87   Pulse (!) 101   Temp 98 F (36.7 C) (Oral)   Resp 13   SpO2 100%   Patient laying in bed, he is awake and alert and generally well-appearing.  He is slightly tachycardic however he states that he feels much better and is very eager to go home at this time.  Plan is to follow-up on UA and p.o. challenge.  Patient tolerated p.o. without difficulties.  UA shows glucose urea with 5 ketones which I suspect is secondary to his previously poor p.o. intake.  Return precautions were discussed with patient who states their understanding.  At the time of discharge patient denied any unaddressed complaints or concerns.  Patient is agreeable for discharge home.  Note: Portions of this report may have been transcribed using voice recognition software. Every effort was made to ensure accuracy; however, inadvertent computerized transcription errors may be present     Cristina Gong, PA-C 08/15/20 1656    Charlynne Pander, MD 08/15/20 [phone]

## 2020-08-15 NOTE — ED Provider Notes (Signed)
[facility] EMERGENCY DEPARTMENT Provider Note   CSN: [id number] Arrival date & time: 08/15/20  1055     History Chief Complaint  Patient presents with  . Anxiety  . Abdominal Pain  . Seizures  . Weight Loss    Travis Wang is a 55 y.o. male.  Patient with history of diabetes, on Metformin, history of anxiety presents the emergency department for vomiting and epigastric pain and pressure.  Patient states that he has been having pressure in his upper abdomen that is worse with certain positions and movements for several months.  He has had significant weight loss over this time.  He has had 2 previous emergency department visits and had a negative CT of the abdomen and pelvis recently.  Patient describes the pain as a "dripping sensation" in his abdomen.  He states that the only thing that makes him feel better is to soak in hot water.  This helps relieve his symptoms.  He has been vomiting daily over the past week.  He shows me a picture which shows yellow vomitus with some dark flecks.  Patient denies heavy NSAID use.  Occasional alcohol use, last use was a week ago.  He does smoke and use marijuana.  No chest pain or shortness of breath.  No reported fevers.  No blood noted in the stool.  He is not on any medications for his stomach.  He has an appointment with [facility] in 5 days.  He has not had any follow-up for his current condition.  Does not see a GI doctor.  Patient's family notes jerking spell this morning lasted between 15 and 30 seconds.  She states that she went to prepare some toast and heard her son asking if the patient was okay.  When she responded, he was jerking for several seconds.  He quickly came back to baseline and was not confused afterwards.  She was concerned that he was having a seizure.  She does report concurrent anxiety.       Past Medical History:  Diagnosis Date  . Anxiety   . Diabetes mellitus without complication  (HCC)     There are no problems to display for this patient.   Past Surgical History:  Procedure Laterality Date  . BACK SURGERY         No family history on file.  Social History   Tobacco Use  . Smoking status: Current Every Day Smoker    Types: Cigarettes  Vaping Use  . Vaping Use: Every day  . Substances: Nicotine  Substance Use Topics  . Alcohol use: Yes  . Drug use: Yes    Types: Marijuana    Home Medications Prior to Admission medications   Medication Sig Start Date End Date Taking? Authorizing Provider  metFORMIN (GLUCOPHAGE) 500 MG tablet Take 1 tablet (500 mg total) by mouth 2 (two) times daily with a meal. 07/17/20   Dykstra, Quitman Livings, MD  polyethylene glycol (MIRALAX) 17 g packet Take 17 g by mouth daily. 07/17/20   Milagros Loll, MD    Allergies    Patient has no known allergies.  Review of Systems   Review of Systems  Constitutional: Positive for appetite change and unexpected weight change. Negative for fever.  HENT: Negative for rhinorrhea and sore throat.   Eyes: Negative for redness.  Respiratory: Negative for cough and shortness of breath.   Cardiovascular: Negative for chest pain.  Gastrointestinal: Positive for abdominal pain, nausea  and vomiting. Negative for diarrhea.  Genitourinary: Negative for dysuria and hematuria.  Musculoskeletal: Negative for myalgias.  Skin: Negative for rash.  Neurological: Negative for headaches.    Physical Exam Updated Vital Signs BP (!) 130/92 (BP Location: Left Arm)   Pulse (!) 110   Temp 98 F (36.7 C) (Oral)   Resp 14   SpO2 100%   Physical Exam Vitals and nursing note reviewed.  Constitutional:      Appearance: He is well-developed.  HENT:     Head: Normocephalic and atraumatic.     Mouth/Throat:     Mouth: Mucous membranes are dry.  Eyes:     General:        Right eye: No discharge.        Left eye: No discharge.     Conjunctiva/sclera: Conjunctivae normal.  Cardiovascular:      Rate and Rhythm: Normal rate and regular rhythm.     Heart sounds: Normal heart sounds.  Pulmonary:     Effort: Pulmonary effort is normal.     Breath sounds: Normal breath sounds.  Abdominal:     Palpations: Abdomen is soft.     Tenderness: There is generalized abdominal tenderness.  Musculoskeletal:     Cervical back: Normal range of motion and neck supple.     Right lower leg: No edema.     Left lower leg: No edema.  Skin:    General: Skin is warm and dry.  Neurological:     Mental Status: He is alert.     ED Results / Procedures / Treatments   Labs (all labs ordered are listed, but only abnormal results are displayed) Labs Reviewed  COMPREHENSIVE METABOLIC PANEL - Abnormal; Notable for the following components:      Result Value   Sodium 125 (*)    Chloride 85 (*)    Glucose, Bld 476 (*)    BUN 42 (*)    Creatinine, Ser 1.48 (*)    GFR, Estimated 56 (*)    Anion gap 17 (*)    All other components within normal limits  CBC - Abnormal; Notable for the following components:   WBC 13.0 (*)    Hemoglobin 17.3 (*)    MCHC 36.4 (*)    All other components within normal limits  LIPASE, BLOOD  URINALYSIS, ROUTINE W REFLEX MICROSCOPIC  TROPONIN I (HIGH SENSITIVITY)  TROPONIN I (HIGH SENSITIVITY)    ED ECG REPORT   Date: 08/15/2020  Rate: 116  Rhythm: sinus tachycardia  QRS Axis: right  Intervals: normal  ST/T Wave abnormalities: normal  Conduction Disutrbances:none  Narrative Interpretation:   Old EKG Reviewed: unchanged except faster  I have personally reviewed the EKG tracing and agree with the computerized printout as noted.  Radiology DG Chest 2 View  Result Date: 08/15/2020 CLINICAL DATA:  Chest pain EXAM: CHEST - 2 VIEW COMPARISON:  07/17/2020 FINDINGS: The lungs are clear without focal pneumonia, edema, pneumothorax or pleural effusion. The cardiopericardial silhouette is within normal limits for size. The visualized bony structures of the thorax show no  acute abnormality. IMPRESSION: No active cardiopulmonary disease. Electronically Signed   By: Kennith Center M.D.   On: 08/15/2020 11:55    Procedures Procedures   Medications Ordered in ED Medications  sodium chloride 0.9 % bolus 1,000 mL (1,000 mLs Intravenous New Bag/Given 08/15/20 1325)  ondansetron (ZOFRAN) injection 4 mg (4 mg Intravenous Given 08/15/20 1326)  pantoprazole (PROTONIX) injection 40 mg (40 mg Intravenous Given 08/15/20 1343)  sodium chloride 0.9 % bolus 1,000 mL (1,000 mLs Intravenous New Bag/Given 08/15/20 1325)  metoCLOPramide (REGLAN) injection 10 mg (10 mg Intravenous Given 08/15/20 1328)    ED Course  I have reviewed the triage vital signs and the nursing notes.  Pertinent labs & imaging results that were available during my care of the patient were reviewed by me and considered in my medical decision making (see chart for details).  Patient seen and examined. Work-up initiated. Medications ordered.  Appears dry on exam, will hydrate.   Vital signs reviewed and are as follows: BP (!) 130/92 (BP Location: Left Arm)   Pulse (!) 110   Temp 98 F (36.7 C) (Oral)   Resp 14   SpO2 100%   12:44 PM Labs reviewed. Will give additional IV fluids for hyperglycemia and mild AKI.  Anion gap 17, bicarb is normal.  Doubt DKA.  2:52 PM patient updated on results.  Additional history obtained earlier from family.  We will hydrate 2 L normal saline, p.o. challenge, recheck blood sugar.  If improving, and no persistent vomiting, patient will be able to be discharged with Reglan, Metformin.  Patient states he is almost out of the Metformin.  He also requests something for anxiety.  Will give course of hydroxyzine.  Signout to [employer] at shift change, pending CBG recheck, PO challenge, completion of IV fluids, 2nd trop, and UA.     MDM Rules/Calculators/A&P                          Patient with abdominal symptoms, weight loss, poorly controlled blood sugars over the past  several months.  Patient has had CT of the abdomen pelvis on previous visit that was normal.  He has PCP follow-up in 5 days at [facility].  Lab work today with signs of dehydration, hyperglycemia without evidence of DKA.  Pending second troponin.  First troponin negative and EKG reassuring.  We will continue to hydrate patient with treatment plan as above.  Anticipate discharge to home.  Wound benefit from GI follow-up.    Final Clinical Impression(s) / ED Diagnoses Final diagnoses:  Hyperglycemia  Non-intractable vomiting with nausea, unspecified vomiting type    Rx / DC Orders ED Discharge Orders         Ordered    hydrOXYzine (ATARAX/VISTARIL) 25 MG tablet  Every 6 hours        08/15/20 1448    metFORMIN (GLUCOPHAGE) 500 MG tablet  2 times daily with meals        08/15/20 1448    metoCLOPramide (REGLAN) 10 MG tablet  Every 6 hours        08/15/20 1448           Renne Crigler, PA-C 08/16/20 1259    Margarita Grizzle, MD 08/17/20 1410

## 2020-08-15 NOTE — ED Notes (Signed)
Pt is tolerating PO fluids very well.  States that he feels much better and is ready to go when we are ready to DC

## 2020-08-15 NOTE — Discharge Instructions (Signed)
Please read and follow all provided instructions.  Your diagnoses today include:  1. Hyperglycemia   2. Non-intractable vomiting with nausea, unspecified vomiting type     Tests performed today include: Blood cell counts (white, red, and platelets) Electrolytes  Kidney function test Urine test to check for infection  Vital signs. See below for your results today.   Medications prescribed:   Take any prescribed medications only as directed.  Home care instructions:  Follow any educational materials contained in this packet.  BE VERY CAREFUL not to take multiple medicines containing Tylenol (also called acetaminophen). Doing so can lead to an overdose which can damage your liver and cause liver failure and possibly death.   Follow-up instructions: Please follow-up with your primary care provider in the next 3 days for further evaluation of your symptoms.   Return instructions:   Please return to the Emergency Department if you experience worsening symptoms.   Please return if you have any other emergent concerns.  Additional Information:  Your vital signs today were: BP 114/85   Pulse 91   Temp 98 F (36.7 C) (Oral)   Resp 15   SpO2 99%  If your blood pressure (BP) was elevated above 135/85 this visit, please have this repeated by your doctor within one month. --------------

## 2020-08-19 NOTE — Progress Notes (Signed)
Patient ID: Travis Wang, male   DOB: [birth year], 55 y.o.   MRN: [id number] Virtual Visit via Telephone Note  I connected with Travis Wang on 08/20/20 at  2:10 PM EDT by telephone and verified that I am speaking with the correct person using two identifiers.  Location: Patient: home Provider: [facility] office    I discussed the limitations, risks, security and privacy concerns of performing an evaluation and management service by telephone and the availability of in person appointments. I also discussed with the patient that there may be a patient responsible charge related to this service. The patient expressed understanding and agreed to proceed.   History of Present Illness: After ED visit 07/17/2020.  Since ED visit blood sugars in the 200s.  Tolerating metformin with minimal SE.  He is taking it once daily.   He says he has not been serious in the past about taking care of his health but he really wants to do better.  He has a glucometer and is checking his glucose bid.  He denies polyuria/polydipsia/vision changes.  He takes the metoclopramide and hydroxyzine only as needed.  No further abdominal pain.  No further N.   From ED note: Travis Wang is a 55 y.o. male.  Patient with history of diabetes, on Metformin, history of anxiety presents the emergency department for vomiting and epigastric pain and pressure.  Patient states that he has been having pressure in his upper abdomen that is worse with certain positions and movements for several months.  He has had significant weight loss over this time.  He has had 2 previous emergency department visits and had a negative CT of the abdomen and pelvis recently.  Patient describes the pain as a "dripping sensation" in his abdomen.  He states that the only thing that makes him feel better is to soak in hot water.  This helps relieve his symptoms.  He has been vomiting daily over the past week.  He shows me a picture which shows yellow vomitus with  some dark flecks.  Patient denies heavy NSAID use.  Occasional alcohol use, last use was a week ago.  He does smoke and use marijuana.  No chest pain or shortness of breath.  No reported fevers.  No blood noted in the stool.  He is not on any medications for his stomach.  He has an appointment with [facility] in 5 days.  He has not had any follow-up for his current condition.  Does not see a GI doctor.  Patient's family notes jerking spell this morning lasted between 15 and 30 seconds.  She states that she went to prepare some toast and heard her son asking if the patient was okay.  When she responded, he was jerking for several seconds.  He quickly came back to baseline and was not confused afterwards.  She was concerned that he was having a seizure.  She does report concurrent anxiety.    Observations/Objective: NAD.  A&Ox3  Assessment and Plan: 1. Type 2 diabetes mellitus with hyperglycemia, unspecified whether long term insulin use (HCC) A1C=9.5 on 07/17/2020 before resuming metformin.  Blood sugars in the 200s since starting metformin once daily.   Uncontrolled-will titrate to 1000mg  bid.  Check blood sugars fasting and at bedtime.  Work on diabetic diet. Drink 80-100 ounces water daily - metFORMIN (GLUCOPHAGE) 500 MG tablet; Take 2 tablets (1,000 mg total) by mouth 2 (two) times daily with a meal.  Dispense: 120 tablet; Refill: 3 -  metoCLOPramide (REGLAN) 10 MG tablet; Take 1 tablet (10 mg total) by mouth every 8 (eight) hours as needed for nausea. Prn nausea  Dispense: 30 tablet; Refill: 0  2. Anxiety - hydrOXYzine (ATARAX/VISTARIL) 25 MG tablet; Take 1 tablet (25 mg total) by mouth every 8 (eight) hours as needed. Prn anxiety  Dispense: 40 tablet; Refill: 0  3. Encounter for examination following treatment at hospital    Follow Up Instructions: Assign PCP/labs in about 3-4 weeks/close follow up   I discussed the assessment and treatment plan with the patient. The  patient was provided an opportunity to ask questions and all were answered. The patient agreed with the plan and demonstrated an understanding of the instructions.   The patient was advised to call back or seek an in-person evaluation if the symptoms worsen or if the condition fails to improve as anticipated.  I provided 15 minutes of non-face-to-face time during this encounter.   Georgian Co, PA-C

## 2020-08-20 ENCOUNTER — Encounter: Payer: Self-pay | Admitting: Physician Assistant

## 2020-08-20 ENCOUNTER — Ambulatory Visit: Payer: Self-pay | Attending: Physician Assistant | Admitting: Physician Assistant

## 2020-08-20 ENCOUNTER — Other Ambulatory Visit: Payer: Self-pay

## 2020-08-20 DIAGNOSIS — F419 Anxiety disorder, unspecified: Secondary | ICD-10-CM

## 2020-08-20 DIAGNOSIS — Z09 Encounter for follow-up examination after completed treatment for conditions other than malignant neoplasm: Secondary | ICD-10-CM

## 2020-08-20 DIAGNOSIS — E1165 Type 2 diabetes mellitus with hyperglycemia: Secondary | ICD-10-CM

## 2020-08-20 MED ORDER — HYDROXYZINE HCL 25 MG PO TABS: 25.0000 mg | ORAL_TABLET | Freq: Three times a day (TID) | ORAL | 0 refills | Status: DC | PRN

## 2020-08-20 MED ORDER — METOCLOPRAMIDE HCL 10 MG PO TABS: 10.0000 mg | ORAL_TABLET | Freq: Three times a day (TID) | ORAL | 0 refills | Status: AC | PRN

## 2020-08-20 MED ORDER — METFORMIN HCL 500 MG PO TABS: 1000.0000 mg | ORAL_TABLET | Freq: Two times a day (BID) | ORAL | 3 refills | Status: DC

## 2020-09-06 ENCOUNTER — Other Ambulatory Visit: Payer: Self-pay | Admitting: Physician Assistant

## 2020-09-06 DIAGNOSIS — F419 Anxiety disorder, unspecified: Secondary | ICD-10-CM

## 2020-09-06 NOTE — Telephone Encounter (Signed)
Requested medication (s) are due for refill today: yes  Requested medication (s) are on the active medication list: yes  Last refill:  08/20/20  Future visit scheduled: yes  Notes to clinic:  Per EMR no PCP. Has upcoming future visit to assign PCP (Dr Delford Field on 09/30/20) Please review.   Requested Prescriptions  Pending Prescriptions Disp Refills   hydrOXYzine (ATARAX/VISTARIL) 25 MG tablet [Pharmacy Med Name: hydrOXYzine HCl 25 MG Oral Tablet] 40 tablet 0    Sig: TAKE 1 TABLET BY MOUTH EVERY 8 HOURS AS NEEDED FOR ANXIETY      Ear, Nose, and Throat:  Antihistamines Passed - 09/06/2020 12:07 PM      Passed - Valid encounter within last 12 months    Recent Outpatient Visits           2 weeks ago Type 2 diabetes mellitus with hyperglycemia, unspecified whether long term insulin use [facility])   [facility] [place], [place]ella [place], [place]       Future Appointments             In 3 weeks Delford Field Charlcie Cradle, MD [facility]

## 2020-09-30 ENCOUNTER — Ambulatory Visit: Payer: Self-pay

## 2020-11-13 ENCOUNTER — Emergency Department (HOSPITAL_COMMUNITY): Payer: Medicaid Other

## 2020-11-13 ENCOUNTER — Emergency Department (HOSPITAL_COMMUNITY)
Admission: EM | Admit: 2020-11-13 | Discharge: 2020-11-13 | Disposition: A | Discharge status: Home / Self Care | Payer: Medicaid Other | Attending: Emergency Medicine | Admitting: Emergency Medicine

## 2020-11-13 ENCOUNTER — Other Ambulatory Visit: Payer: Self-pay

## 2020-11-13 ENCOUNTER — Encounter (HOSPITAL_COMMUNITY): Payer: Self-pay

## 2020-11-13 DIAGNOSIS — M79603 Pain in arm, unspecified: Secondary | ICD-10-CM | POA: Diagnosis not present

## 2020-11-13 DIAGNOSIS — M79606 Pain in leg, unspecified: Secondary | ICD-10-CM | POA: Insufficient documentation

## 2020-11-13 DIAGNOSIS — K5900 Constipation, unspecified: Secondary | ICD-10-CM | POA: Insufficient documentation

## 2020-11-13 DIAGNOSIS — E119 Type 2 diabetes mellitus without complications: Secondary | ICD-10-CM | POA: Insufficient documentation

## 2020-11-13 DIAGNOSIS — Z7984 Long term (current) use of oral hypoglycemic drugs: Secondary | ICD-10-CM | POA: Diagnosis not present

## 2020-11-13 DIAGNOSIS — K529 Noninfective gastroenteritis and colitis, unspecified: Secondary | ICD-10-CM | POA: Insufficient documentation

## 2020-11-13 DIAGNOSIS — R1084 Generalized abdominal pain: Secondary | ICD-10-CM | POA: Diagnosis present

## 2020-11-13 DIAGNOSIS — F17210 Nicotine dependence, cigarettes, uncomplicated: Secondary | ICD-10-CM | POA: Insufficient documentation

## 2020-11-13 LAB — CBC
HCT: 50.1 % (ref 39.0–52.0)
Hemoglobin: 17.6 g/dL — ABNORMAL HIGH (ref 13.0–17.0)
MCH: 31.3 pg (ref 26.0–34.0)
MCHC: 35.1 g/dL (ref 30.0–36.0)
MCV: 89.1 fL (ref 80.0–100.0)
Platelets: 266 10*3/uL (ref 150–400)
RBC: 5.62 MIL/uL (ref 4.22–5.81)
RDW: 12.6 % (ref 11.5–15.5)
WBC: 10.4 10*3/uL (ref 4.0–10.5)
nRBC: 0 % (ref 0.0–0.2)

## 2020-11-13 LAB — I-STAT CHEM 8, ED
BUN: 44 mg/dL — ABNORMAL HIGH (ref 6–20)
Calcium, Ion: 1.15 mmol/L (ref 1.15–1.40)
Chloride: 93 mmol/L — ABNORMAL LOW (ref 98–111)
Creatinine, Ser: 1.7 mg/dL — ABNORMAL HIGH (ref 0.61–1.24)
Glucose, Bld: 199 mg/dL — ABNORMAL HIGH (ref 70–99)
HCT: 54 % — ABNORMAL HIGH (ref 39.0–52.0)
Hemoglobin: 18.4 g/dL — ABNORMAL HIGH (ref 13.0–17.0)
Potassium: 4.3 mmol/L (ref 3.5–5.1)
Sodium: 133 mmol/L — ABNORMAL LOW (ref 135–145)
TCO2: 28 mmol/L (ref 22–32)

## 2020-11-13 LAB — COMPREHENSIVE METABOLIC PANEL
ALT: 16 U/L (ref 0–44)
AST: 16 U/L (ref 15–41)
Albumin: 5.6 g/dL — ABNORMAL HIGH (ref 3.5–5.0)
Alkaline Phosphatase: 41 U/L (ref 38–126)
Anion gap: 16 — ABNORMAL HIGH (ref 5–15)
BUN: 48 mg/dL — ABNORMAL HIGH (ref 6–20)
CO2: 26 mmol/L (ref 22–32)
Calcium: 10.4 mg/dL — ABNORMAL HIGH (ref 8.9–10.3)
Chloride: 92 mmol/L — ABNORMAL LOW (ref 98–111)
Creatinine, Ser: 1.69 mg/dL — ABNORMAL HIGH (ref 0.61–1.24)
GFR, Estimated: 48 mL/min — ABNORMAL LOW (ref >60)
Glucose, Bld: 202 mg/dL — ABNORMAL HIGH (ref 70–99)
Potassium: 4.4 mmol/L (ref 3.5–5.1)
Sodium: 134 mmol/L — ABNORMAL LOW (ref 135–145)
Total Bilirubin: 0.9 mg/dL (ref 0.3–1.2)
Total Protein: 8.8 g/dL — ABNORMAL HIGH (ref 6.5–8.1)

## 2020-11-13 LAB — CBG MONITORING, ED: Glucose-Capillary: 204 mg/dL — ABNORMAL HIGH (ref 70–99)

## 2020-11-13 LAB — LIPASE, BLOOD: Lipase: 26 U/L (ref 11–51)

## 2020-11-13 MED ORDER — SODIUM CHLORIDE 0.9 % IV BOLUS
1000.0000 mL | Freq: Once | INTRAVENOUS | Status: AC
  Administered 2020-11-13: 1000 mL via INTRAVENOUS

## 2020-11-13 MED ORDER — IOHEXOL 350 MG/ML SOLN
75.0000 mL | Freq: Once | INTRAVENOUS | Status: AC | PRN
  Administered 2020-11-13: 75 mL via INTRAVENOUS

## 2020-11-13 MED ORDER — ONDANSETRON 4 MG PO TBDP: ORAL_TABLET | ORAL | 0 refills | Status: AC

## 2020-11-13 MED ORDER — SODIUM CHLORIDE (PF) 0.9 % IJ SOLN
INTRAMUSCULAR | Status: AC
  Filled 2020-11-13: qty 50

## 2020-11-13 MED ORDER — PANTOPRAZOLE SODIUM 20 MG PO TBEC: 20.0000 mg | DELAYED_RELEASE_TABLET | Freq: Every day | ORAL | 0 refills | Status: AC

## 2020-11-13 MED ORDER — HYDROMORPHONE HCL 1 MG/ML IJ SOLN
0.5000 mg | Freq: Once | INTRAMUSCULAR | Status: AC
  Administered 2020-11-13: 0.5 mg via INTRAVENOUS
  Filled 2020-11-13: qty 1

## 2020-11-13 MED ORDER — ONDANSETRON HCL 4 MG/2ML IJ SOLN
4.0000 mg | Freq: Once | INTRAMUSCULAR | Status: AC
  Administered 2020-11-13: 4 mg via INTRAVENOUS
  Filled 2020-11-13: qty 2

## 2020-11-13 NOTE — ED Triage Notes (Addendum)
Patient c/o mid abdominal pain, N/V and states he has not had a BM in 4-5 days. Patient also c/o bilateral arm, leg, and foot pain. Patient reports a history of neuropathy.  Patient's wife reports that the patient is very anxious.

## 2020-11-13 NOTE — ED Provider Notes (Signed)
[facility] note.  Patient has a history of diabetes and complains of nausea vomiting and not having a bowel movement recently.  He also complains of numbness and tingling in his arms.  Physical exam patient alert in mild distress with minimal tenderness to abdomen.  Labs ordered   Bethann Berkshire, MD 11/13/20 [phone]

## 2020-11-13 NOTE — ED Provider Notes (Signed)
[facility]-EMERGENCY DEPT Provider Note   CSN: [id number] Arrival date & time: 11/13/20  0855     History Chief Complaint  Patient presents with   Abdominal Pain   Constipation   Arm Pain   Leg Pain    Travis Wang is a 55 y.o. male.  Patient complains of vomiting and some abdominal discomfort.  No fever no chills  The history is provided by the patient and medical records. No language interpreter was used.  Abdominal Pain Pain location:  Generalized Pain quality: aching   Pain radiates to:  Does not radiate Pain severity:  Mild Onset quality:  Sudden Timing:  Intermittent Progression:  Waxing and waning Chronicity:  New Context: not alcohol use   Associated symptoms: constipation and vomiting   Associated symptoms: no chest pain, no cough, no diarrhea, no fatigue and no hematuria   Constipation Associated symptoms: abdominal pain and vomiting   Associated symptoms: no back pain and no diarrhea   Arm Pain Associated symptoms include abdominal pain. Pertinent negatives include no chest pain and no headaches.  Leg Pain Associated symptoms: no back pain and no fatigue       Past Medical History:  Diagnosis Date   Anxiety    Diabetes mellitus without complication [facility])     Patient Active Problem List   Diagnosis Date Noted   Type 2 diabetes mellitus with hyperglycemia (HCC) 08/20/2020    Past Surgical History:  Procedure Laterality Date   BACK SURGERY         Family History  Problem Relation Age of Onset   COPD Mother    Diabetes Father     Social History   Tobacco Use   Smoking status: Every Day    Packs/day: 0.50    Pack years: 0.00    Types: Cigarettes   Smokeless tobacco: Never  Vaping Use   Vaping Use: Former   Substances: Nicotine  Substance Use Topics   Alcohol use: Yes   Drug use: Yes    Types: Marijuana    Home Medications Prior to Admission medications   Medication Sig Start Date End Date Taking?  Authorizing Provider  metFORMIN (GLUCOPHAGE) 500 MG tablet Take 2 tablets (1,000 mg total) by mouth 2 (two) times daily with a meal. 08/20/20  Yes McClung, Angela M, PA-C  ondansetron (ZOFRAN ODT) 4 MG disintegrating tablet 4mg  ODT q4 hours prn nausea/vomit 11/13/20  Yes 01/14/21, MD  hydrOXYzine (ATARAX/VISTARIL) 25 MG tablet TAKE 1 TABLET BY MOUTH EVERY 8 HOURS AS NEEDED FOR ANXIETY Patient not taking: Reported on 11/13/2020 09/07/20   11/07/20, MD  metoCLOPramide (REGLAN) 10 MG tablet Take 1 tablet (10 mg total) by mouth every 8 (eight) hours as needed for nausea. Prn nausea Patient not taking: Reported on 11/13/2020 08/20/20   08/22/20, PA-C  polyethylene glycol (MIRALAX) 17 g packet Take 17 g by mouth daily. Patient not taking: Reported on 11/13/2020 07/17/20   09/16/20, MD    Allergies    Patient has no known allergies.  Review of Systems   Review of Systems  Constitutional:  Negative for appetite change and fatigue.  HENT:  Negative for congestion, ear discharge and sinus pressure.   Eyes:  Negative for discharge.  Respiratory:  Negative for cough.   Cardiovascular:  Negative for chest pain.  Gastrointestinal:  Positive for abdominal pain, constipation and vomiting. Negative for diarrhea.  Genitourinary:  Negative for frequency and hematuria.  Musculoskeletal:  Negative for  back pain.  Skin:  Negative for rash.  Neurological:  Negative for seizures and headaches.  Psychiatric/Behavioral:  Negative for hallucinations.    Physical Exam Updated Vital Signs BP 120/78   Pulse 78   Temp 98.1 F (36.7 C) (Oral)   Resp 15   Ht 5\' 11"  (1.803 m)   Wt 59 kg   SpO2 99%   BMI 18.13 kg/m   Physical Exam Vitals and nursing note reviewed.  Constitutional:      Appearance: He is well-developed.  HENT:     Head: Normocephalic.     Nose: Nose normal.  Eyes:     General: No scleral icterus.    Conjunctiva/sclera: Conjunctivae normal.  Neck:     Thyroid: No  thyromegaly.  Cardiovascular:     Rate and Rhythm: Normal rate and regular rhythm.     Heart sounds: No murmur heard.   No friction rub. No gallop.  Pulmonary:     Breath sounds: No stridor. No wheezing or rales.  Chest:     Chest wall: No tenderness.  Abdominal:     General: There is no distension.     Tenderness: There is abdominal tenderness. There is no rebound.  Musculoskeletal:        General: Normal range of motion.     Cervical back: Neck supple.  Lymphadenopathy:     Cervical: No cervical adenopathy.  Skin:    Findings: No erythema or rash.  Neurological:     Mental Status: He is alert and oriented to person, place, and time.     Motor: No abnormal muscle tone.     Coordination: Coordination normal.  Psychiatric:        Behavior: Behavior normal.    ED Results / Procedures / Treatments   Labs (all labs ordered are listed, but only abnormal results are displayed) Labs Reviewed  COMPREHENSIVE METABOLIC PANEL - Abnormal; Notable for the following components:      Result Value   Sodium 134 (*)    Chloride 92 (*)    Glucose, Bld 202 (*)    BUN 48 (*)    Creatinine, Ser 1.69 (*)    Calcium 10.4 (*)    Total Protein 8.8 (*)    Albumin 5.6 (*)    GFR, Estimated 48 (*)    Anion gap 16 (*)    All other components within normal limits  CBC - Abnormal; Notable for the following components:   Hemoglobin 17.6 (*)    All other components within normal limits  CBG MONITORING, ED - Abnormal; Notable for the following components:   Glucose-Capillary 204 (*)    All other components within normal limits  I-STAT CHEM 8, ED - Abnormal; Notable for the following components:   Sodium 133 (*)    Chloride 93 (*)    BUN 44 (*)    Creatinine, Ser 1.70 (*)    Glucose, Bld 199 (*)    Hemoglobin 18.4 (*)    HCT 54.0 (*)    All other components within normal limits  LIPASE, BLOOD  URINALYSIS, ROUTINE W REFLEX MICROSCOPIC    EKG None  Radiology CT ABDOMEN PELVIS W  CONTRAST  Addendum Date: 11/13/2020   ADDENDUM REPORT: 11/13/2020 15:51 ADDENDUM: Dictation error in initial impression with inclusion of "intracranial." Should only state no acute abnormality. Electronically Signed   By: 01/14/2021 M.D.   On: 11/13/2020 15:51   Result Date: 11/13/2020 CLINICAL DATA:  Abdominal pain EXAM: CT ABDOMEN AND  PELVIS WITH CONTRAST TECHNIQUE: Multidetector CT imaging of the abdomen and pelvis was performed using the standard protocol following bolus administration of intravenous contrast. CONTRAST:  16mL OMNIPAQUE IOHEXOL 350 MG/ML SOLN COMPARISON:  07/17/2020 FINDINGS: Lower chest: No acute abnormality. Hepatobiliary: No focal liver abnormality is seen. Gallbladder is mildly distended. No gallstones, gallbladder wall thickening, or biliary dilatation. Pancreas: Unremarkable. No pancreatic ductal dilatation or surrounding inflammatory changes. Spleen: Unremarkable. Adrenals/Urinary Tract: Adrenals, kidneys, and bladder are unremarkable. Stomach/Bowel: Stomach is within normal limits. Bowel is normal in caliber. Normal appendix. Stool burden is moderate and greater on the right. Vascular/Lymphatic: Mild aortic atherosclerosis. No enlarged lymph nodes. Reproductive: Unremarkable. Other: No free fluid.  Abdominal wall is unremarkable. Musculoskeletal: Lower lumbar degenerative changes. IMPRESSION: No acute intracranial abnormality. Electronically Signed: By: Guadlupe Spanish M.D. On: 11/13/2020 15:09    Procedures Procedures   Medications Ordered in ED Medications  sodium chloride 0.9 % bolus 1,000 mL (1,000 mLs Intravenous New Bag/Given 11/13/20 1340)  ondansetron (ZOFRAN) injection 4 mg (4 mg Intravenous Given 11/13/20 1340)  HYDROmorphone (DILAUDID) injection 0.5 mg (0.5 mg Intravenous Given 11/13/20 1419)  sodium chloride (PF) 0.9 % injection (  Given 11/13/20 1438)  iohexol (OMNIPAQUE) 350 MG/ML injection 75 mL (75 mLs Intravenous Contrast Given 11/13/20 1422)    ED Course  I  have reviewed the triage vital signs and the nursing notes.  Pertinent labs & imaging results that were available during my care of the patient were reviewed by me and considered in my medical decision making (see chart for details).    MDM Rules/Calculators/A&P                          Patient with vomiting and mild abdominal discomfort.  Labs and x-rays unremarkable.  Probable viral infection.  He is given Zofran and Protonix Final Clinical Impression(s) / ED Diagnoses Final diagnoses:  Gastroenteritis    Rx / DC Orders ED Discharge Orders          Ordered    ondansetron (ZOFRAN ODT) 4 MG disintegrating tablet        11/13/20 1555             Bethann Berkshire, MD 11/13/20 1557

## 2020-11-13 NOTE — ED Notes (Signed)
Pt taken to CT.

## 2020-11-13 NOTE — ED Notes (Signed)
Pt's wife at bedside.

## 2020-11-13 NOTE — Discharge Instructions (Addendum)
Take Tylenol for pain drink plenty of fluids follow-up with your family doctor next week if not improving

## 2020-11-18 ENCOUNTER — Telehealth: Payer: Self-pay

## 2020-11-18 NOTE — Telephone Encounter (Signed)
Copied from CRM [id number]. Topic: Appointment Scheduling - Scheduling Inquiry for Clinic >> Nov 18, 2020  1:35 PM Elliot Gault wrote: Patient disconnected after being hold more then 7 minutes, please reach out to the patient reagarfing scheduling appointment with a social worker. Reached out to the patient but was unsuccessful

## 2020-11-19 NOTE — Telephone Encounter (Signed)
I spoke with this pt and he would like to get established with a PCP. I'm not sure if any of our sites are accepting new patients. Please advise.

## 2020-12-08 ENCOUNTER — Telehealth: Payer: Self-pay

## 2020-12-08 NOTE — Telephone Encounter (Signed)
Following up on a request to reach out to the patient for housing resources. Verified the patients name and date of birth. Patient share they are looking for housing for him and his son. They are open to a shelter but are looking for stable housing. Patient shared they currently receive a disability check and food stamps. CM discussed with the patient some of the limitations for housing in [place].

## 2020-12-21 NOTE — Telephone Encounter (Signed)
He was mailed information for [facility] resources for long term/short term housing and social serve.

## 2020-12-22 ENCOUNTER — Encounter: Payer: Self-pay | Admitting: Family Medicine

## 2020-12-22 ENCOUNTER — Ambulatory Visit: Payer: Self-pay | Admitting: Family

## 2020-12-22 ENCOUNTER — Other Ambulatory Visit: Payer: Self-pay

## 2020-12-22 ENCOUNTER — Ambulatory Visit (INDEPENDENT_AMBULATORY_CARE_PROVIDER_SITE_OTHER): Payer: Medicaid Other | Admitting: Family Medicine

## 2020-12-22 VITALS — BP 109/72 | HR 74 | Temp 97.5°F | Resp 16 | Wt 133.0 lb

## 2020-12-22 DIAGNOSIS — R1030 Lower abdominal pain, unspecified: Secondary | ICD-10-CM | POA: Diagnosis not present

## 2020-12-22 DIAGNOSIS — R634 Abnormal weight loss: Secondary | ICD-10-CM

## 2020-12-22 DIAGNOSIS — Z7689 Persons encountering health services in other specified circumstances: Secondary | ICD-10-CM | POA: Diagnosis not present

## 2020-12-22 DIAGNOSIS — E1165 Type 2 diabetes mellitus with hyperglycemia: Secondary | ICD-10-CM

## 2020-12-22 LAB — POCT GLYCOSYLATED HEMOGLOBIN (HGB A1C): HbA1c, POC (controlled diabetic range): 6.8 % (ref 0.0–7.0)

## 2020-12-22 LAB — GLUCOSE, POCT (MANUAL RESULT ENTRY): POC Glucose: 207 mg/dl — AB (ref 70–99)

## 2020-12-22 MED ORDER — METFORMIN HCL 500 MG PO TABS: 1000.0000 mg | ORAL_TABLET | Freq: Two times a day (BID) | ORAL | 3 refills | Status: AC

## 2020-12-22 MED ORDER — GABAPENTIN 100 MG PO CAPS: 100.0000 mg | ORAL_CAPSULE | Freq: Three times a day (TID) | ORAL | 1 refills | Status: AC

## 2020-12-22 NOTE — Progress Notes (Signed)
F/u DM- neuropathy Abdominal pain, tenderness, burning SOB

## 2020-12-22 NOTE — Progress Notes (Signed)
New Patient Office Visit  Subjective:  Patient ID: Travis Wang, male    DOB: [birth year]  Age: 55 y.o. MRN: [id number]  CC:  Chief Complaint  Patient presents with   Establish Care    HPI Travis Wang presents for to establish care.  Patient's main concern is weight loss with decreased appetite.  Patient reports that approximately 9 months ago he weighed 215 pounds and has lost to his present weight of 133 pounds.  Patient also reports abdominal pain.  Workups of the abdominal pain to this point have been unremarkable.  Patient denies nausea vomiting constipation or diarrhea.  Past Medical History:  Diagnosis Date   Anxiety    Diabetes mellitus without complication (HCC)     Past Surgical History:  Procedure Laterality Date   BACK SURGERY      Family History  Problem Relation Age of Onset   COPD Mother    Diabetes Father     Social History   Socioeconomic History   Marital status: Legally Separated    Spouse name: Not on file   Number of children: Not on file   Years of education: Not on file   Highest education level: Not on file  Occupational History   Not on file  Tobacco Use   Smoking status: Every Day    Packs/day: 0.50    Types: Cigarettes   Smokeless tobacco: Never  Vaping Use   Vaping Use: Former   Substances: Nicotine  Substance and Sexual Activity   Alcohol use: Yes   Drug use: Yes    Types: Marijuana   Sexual activity: Not Currently  Other Topics Concern   Not on file  Social History Narrative   Not on file   Social Determinants of Health   Financial Resource Strain: Not on file  Food Insecurity: Not on file  Transportation Needs: Not on file  Physical Activity: Not on file  Stress: Not on file  Social Connections: Not on file  Intimate Partner Violence: Not on file    ROS Review of Systems  Constitutional:  Positive for appetite change, fatigue and unexpected weight change.  Respiratory:  Positive for shortness of breath.    Gastrointestinal:  Positive for abdominal distention.  All other systems reviewed and are negative.  Objective:   Today's Vitals: BP 109/72 (BP Location: Right Arm, Patient Position: Sitting, Cuff Size: Normal)   Pulse 74   Temp (!) 97.5 F (36.4 C)   Resp 16   Wt 133 lb (60.3 kg)   SpO2 98%   BMI 18.55 kg/m   Physical Exam Vitals and nursing note reviewed.  Constitutional:      General: He is not in acute distress.    Appearance: He is cachectic.  Cardiovascular:     Rate and Rhythm: Normal rate and regular rhythm.  Pulmonary:     Effort: Pulmonary effort is normal.     Breath sounds: Normal breath sounds.  Abdominal:     Palpations: Abdomen is soft.     Tenderness: There is no abdominal tenderness.  Musculoskeletal:     Right lower leg: No edema.     Left lower leg: No edema.  Neurological:     General: No focal deficit present.     Mental Status: He is alert and oriented to person, place, and time.  Psychiatric:        Mood and Affect: Mood normal.        Behavior: Behavior normal.    Assessment &  Plan:  1. Type 2 diabetes mellitus with hyperglycemia, without long-term current use of insulin (HCC) Patient's A1c is improved and now at goal. continue - HgB A1c - Glucose (CBG)  2.Weight loss, non-intentional Labwork obtained. Referral to GI for further eval/mgt - Ambulatory referral to Gastroenterology - HIV antibody (with reflex)  3. Lower abdominal pain Referral as above  4. Encounter to establish care     Outpatient Encounter Medications as of 12/22/2020  Medication Sig   gabapentin (NEURONTIN) 100 MG capsule Take 1 capsule (100 mg total) by mouth 3 (three) times daily.   metoCLOPramide (REGLAN) 10 MG tablet Take 1 tablet (10 mg total) by mouth every 8 (eight) hours as needed for nausea. Prn nausea   pantoprazole (PROTONIX) 20 MG tablet Take 1 tablet (20 mg total) by mouth daily.   [DISCONTINUED] metFORMIN (GLUCOPHAGE) 500 MG tablet Take 2 tablets  (1,000 mg total) by mouth 2 (two) times daily with a meal.   hydrOXYzine (ATARAX/VISTARIL) 25 MG tablet TAKE 1 TABLET BY MOUTH EVERY 8 HOURS AS NEEDED FOR ANXIETY (Patient not taking: No sig reported)   metFORMIN (GLUCOPHAGE) 500 MG tablet Take 2 tablets (1,000 mg total) by mouth 2 (two) times daily with a meal.   ondansetron (ZOFRAN ODT) 4 MG disintegrating tablet 4mg  ODT q4 hours prn nausea/vomit (Patient not taking: Reported on 12/22/2020)   polyethylene glycol (MIRALAX) 17 g packet Take 17 g by mouth daily. (Patient not taking: No sig reported)   No facility-administered encounter medications on file as of 12/22/2020.    Follow-up: Return in about 3 months (around 03/24/2021) for follow up.   03/26/2021, MD

## 2020-12-23 LAB — HIV ANTIBODY (ROUTINE TESTING W REFLEX): HIV Screen 4th Generation wRfx: NONREACTIVE

## 2021-03-26 ENCOUNTER — Ambulatory Visit: Payer: Medicaid Other | Admitting: Family Medicine

## 2022-07-28 IMAGING — CR DG CHEST 2V
2 series · 2 of 2 positions shown · non-contrast
Comparison: 07/17/2020

CLINICAL DATA: Chest pain

EXAM:
CHEST - 2 VIEW

[chest pa]
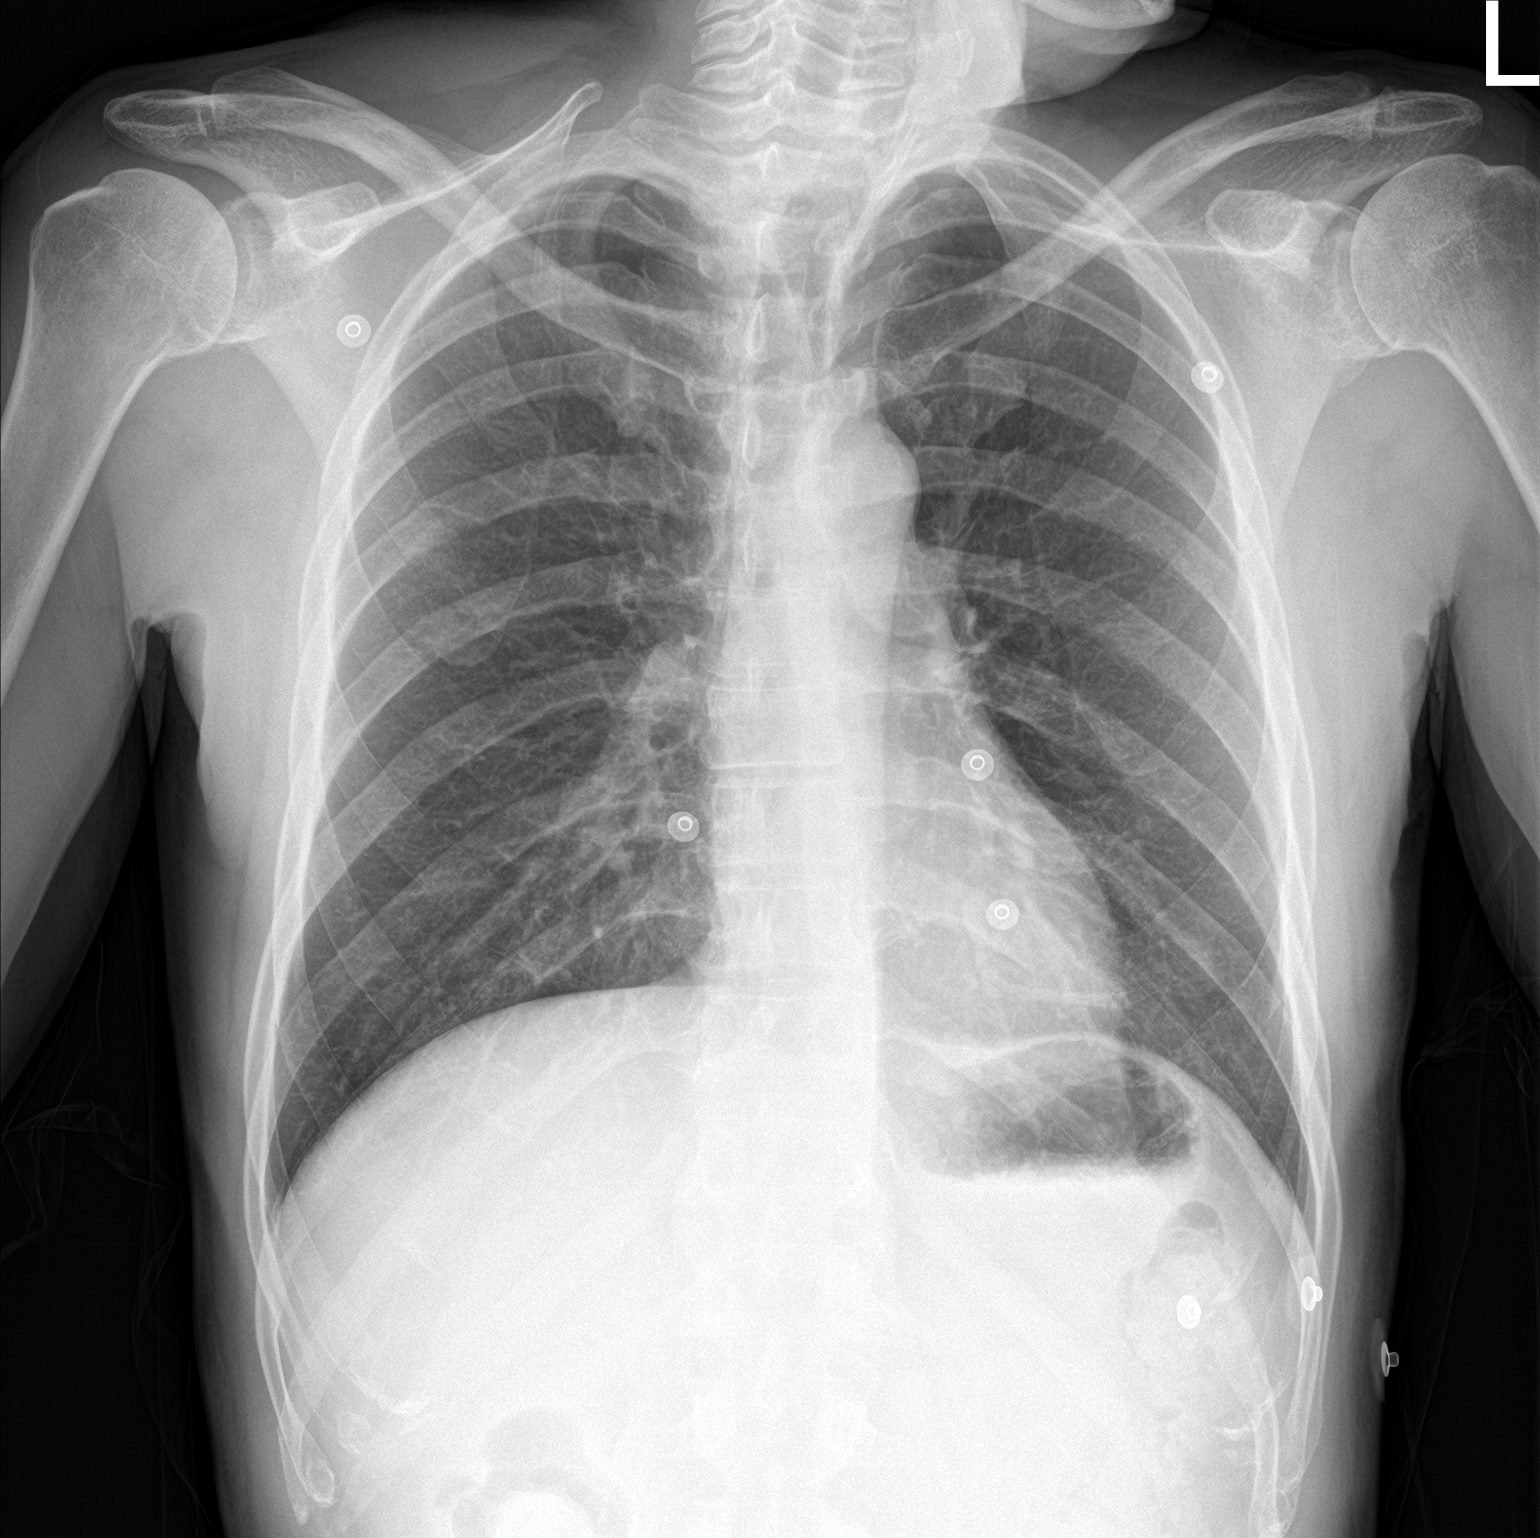

[chest lat]
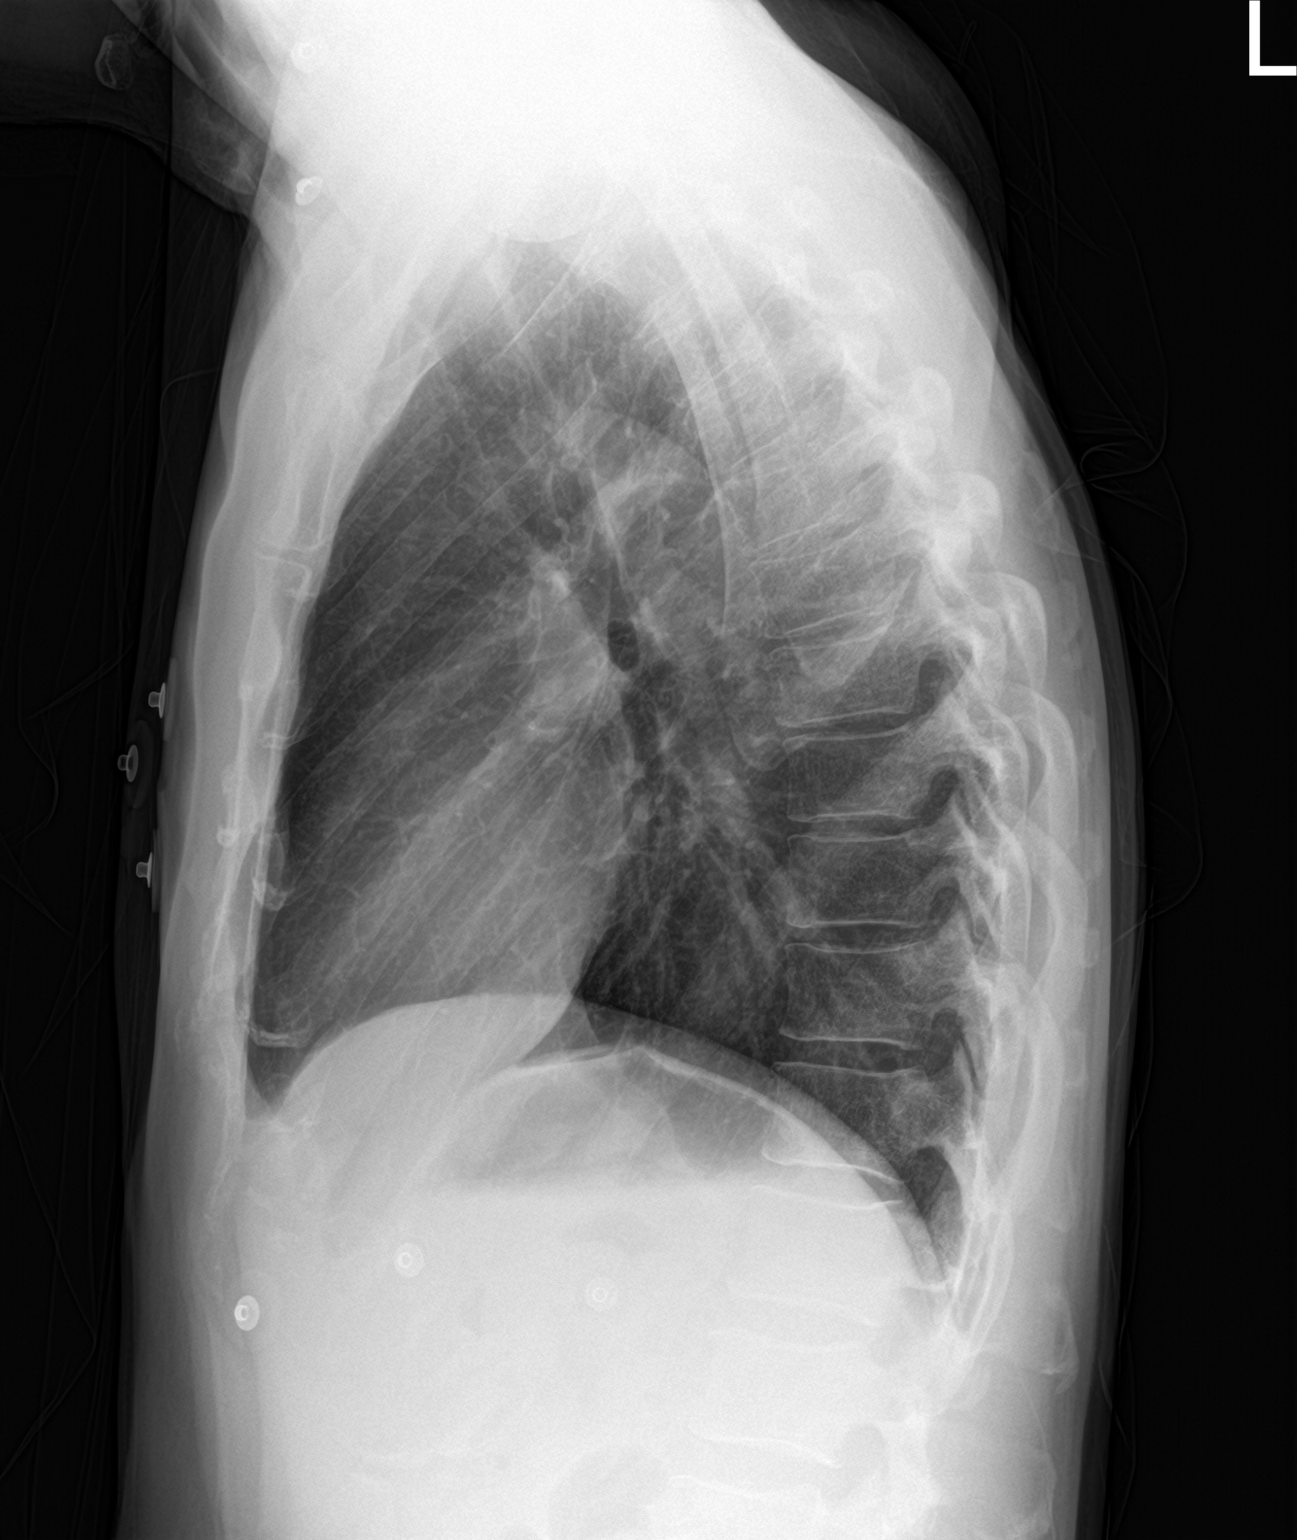

[2 of 2 positions shown; findings below may reference images not displayed]

FINDINGS: The lungs are clear without focal pneumonia, edema, pneumothorax or
pleural effusion. The cardiopericardial silhouette is within normal
limits for size. The visualized bony structures of the thorax show
no acute abnormality.
IMPRESSION: No active cardiopulmonary disease.
